# Patient Record
Sex: Male | Born: 1950 | Race: White | Hispanic: No | Marital: Married
Health system: Midwestern US, Academic
[De-identification: ages and names within clinical notes are randomized; demographics above are authoritative.]

---

## 2017-06-13 LAB — COMPREHENSIVE METABOLIC PANEL
Lab: 133 — ABNORMAL LOW (ref 136–145)
Lab: 96 — ABNORMAL LOW (ref 98–107)

## 2017-06-13 LAB — CBC: Lab: 5.6

## 2017-06-13 LAB — THYROID STIMULATING HORMONE-TSH: Lab: 3.9 — ABNORMAL HIGH (ref 80.0–99.0)

## 2017-06-13 LAB — PROSTATIC SPECIFIC ANTIGEN-PSA: Lab: 0.7

## 2017-07-22 ENCOUNTER — Encounter: Admit: 2017-07-22 | Discharge: 2017-07-22 | Payer: BC Managed Care – PPO

## 2017-07-22 DIAGNOSIS — I10 Essential (primary) hypertension: Principal | ICD-10-CM

## 2017-07-25 ENCOUNTER — Encounter: Admit: 2017-07-25 | Discharge: 2017-07-25 | Payer: BC Managed Care – PPO

## 2017-07-31 ENCOUNTER — Encounter: Admit: 2017-07-31 | Discharge: 2017-07-31 | Payer: BC Managed Care – PPO

## 2017-08-01 ENCOUNTER — Encounter: Admit: 2017-08-01 | Discharge: 2017-08-01 | Payer: BC Managed Care – PPO

## 2017-08-01 ENCOUNTER — Ambulatory Visit: Admit: 2017-08-01 | Discharge: 2017-08-02 | Payer: MEDICARE

## 2017-08-01 DIAGNOSIS — E6609 Other obesity due to excess calories: ICD-10-CM

## 2017-08-01 DIAGNOSIS — I159 Secondary hypertension, unspecified: ICD-10-CM

## 2017-08-01 DIAGNOSIS — F1010 Alcohol abuse, uncomplicated: ICD-10-CM

## 2017-08-01 DIAGNOSIS — R0602 Shortness of breath: ICD-10-CM

## 2017-08-01 DIAGNOSIS — E785 Hyperlipidemia, unspecified: Principal | ICD-10-CM

## 2017-08-01 DIAGNOSIS — E039 Hypothyroidism, unspecified: ICD-10-CM

## 2017-08-01 DIAGNOSIS — Z720 Tobacco use: ICD-10-CM

## 2017-08-01 MED ORDER — CARVEDILOL 25 MG PO TAB
25 mg | ORAL_TABLET | Freq: Two times a day (BID) | ORAL | 3 refills | 90.00000 days | Status: AC
Start: 2017-08-01 — End: 2018-03-04

## 2017-08-01 MED ORDER — LOSARTAN 50 MG PO TAB
50 mg | ORAL_TABLET | Freq: Two times a day (BID) | ORAL | 3 refills | 30.00000 days | Status: AC
Start: 2017-08-01 — End: 2017-08-28

## 2017-08-13 ENCOUNTER — Ambulatory Visit: Admit: 2017-08-13 | Discharge: 2017-08-13 | Payer: BC Managed Care – PPO

## 2017-08-13 ENCOUNTER — Ambulatory Visit: Admit: 2017-08-13 | Discharge: 2017-08-14 | Payer: MEDICARE

## 2017-08-13 ENCOUNTER — Encounter: Admit: 2017-08-13 | Discharge: 2017-08-13 | Payer: BC Managed Care – PPO

## 2017-08-13 DIAGNOSIS — I10 Essential (primary) hypertension: Principal | ICD-10-CM

## 2017-08-13 DIAGNOSIS — E785 Hyperlipidemia, unspecified: Principal | ICD-10-CM

## 2017-08-19 ENCOUNTER — Encounter: Admit: 2017-08-19 | Discharge: 2017-08-19 | Payer: BC Managed Care – PPO

## 2017-08-28 ENCOUNTER — Encounter: Admit: 2017-08-28 | Discharge: 2017-08-28 | Payer: BC Managed Care – PPO

## 2017-08-28 MED ORDER — LOSARTAN 50 MG PO TAB
50 mg | ORAL_TABLET | Freq: Two times a day (BID) | ORAL | 3 refills | 30.00000 days | Status: AC
Start: 2017-08-28 — End: 2017-12-12

## 2017-09-03 ENCOUNTER — Encounter: Admit: 2017-09-03 | Discharge: 2017-09-03 | Payer: BC Managed Care – PPO

## 2017-09-03 ENCOUNTER — Ambulatory Visit: Admit: 2017-09-03 | Discharge: 2017-09-04 | Payer: MEDICARE

## 2017-09-03 DIAGNOSIS — I159 Secondary hypertension, unspecified: Principal | ICD-10-CM

## 2017-09-03 DIAGNOSIS — F1010 Alcohol abuse, uncomplicated: ICD-10-CM

## 2017-09-03 DIAGNOSIS — Z01810 Encounter for preprocedural cardiovascular examination: ICD-10-CM

## 2017-09-03 DIAGNOSIS — Z720 Tobacco use: ICD-10-CM

## 2017-09-03 DIAGNOSIS — E039 Hypothyroidism, unspecified: ICD-10-CM

## 2017-09-03 DIAGNOSIS — E785 Hyperlipidemia, unspecified: ICD-10-CM

## 2017-09-03 DIAGNOSIS — R0602 Shortness of breath: ICD-10-CM

## 2017-09-03 DIAGNOSIS — E6609 Other obesity due to excess calories: ICD-10-CM

## 2017-09-13 ENCOUNTER — Encounter: Admit: 2017-09-13 | Discharge: 2017-09-13 | Payer: BC Managed Care – PPO

## 2017-11-26 ENCOUNTER — Encounter: Admit: 2017-11-26 | Discharge: 2017-11-26 | Payer: BC Managed Care – PPO

## 2017-12-12 ENCOUNTER — Ambulatory Visit: Admit: 2017-12-12 | Discharge: 2017-12-13 | Payer: MEDICARE

## 2017-12-12 ENCOUNTER — Encounter: Admit: 2017-12-12 | Discharge: 2017-12-12 | Payer: BC Managed Care – PPO

## 2017-12-12 DIAGNOSIS — I159 Secondary hypertension, unspecified: Principal | ICD-10-CM

## 2017-12-12 DIAGNOSIS — E6609 Other obesity due to excess calories: ICD-10-CM

## 2017-12-12 DIAGNOSIS — F1010 Alcohol abuse, uncomplicated: ICD-10-CM

## 2017-12-12 DIAGNOSIS — E785 Hyperlipidemia, unspecified: ICD-10-CM

## 2017-12-12 DIAGNOSIS — R0602 Shortness of breath: ICD-10-CM

## 2017-12-12 DIAGNOSIS — Z720 Tobacco use: ICD-10-CM

## 2017-12-12 DIAGNOSIS — E039 Hypothyroidism, unspecified: ICD-10-CM

## 2017-12-12 MED ORDER — LOSARTAN 50 MG PO TAB
50 mg | ORAL_TABLET | Freq: Two times a day (BID) | ORAL | 3 refills | 90.00000 days | Status: AC
Start: 2017-12-12 — End: 2018-03-04

## 2017-12-12 MED ORDER — CHLORTHALIDONE 25 MG PO TAB
25 mg | ORAL_TABLET | Freq: Every day | ORAL | 3 refills | Status: AC
Start: 2017-12-12 — End: 2018-02-27

## 2017-12-13 ENCOUNTER — Encounter: Admit: 2017-12-13 | Discharge: 2017-12-13 | Payer: BC Managed Care – PPO

## 2017-12-13 DIAGNOSIS — I159 Secondary hypertension, unspecified: Principal | ICD-10-CM

## 2017-12-20 ENCOUNTER — Encounter: Admit: 2017-12-20 | Discharge: 2017-12-20 | Payer: BC Managed Care – PPO

## 2017-12-20 DIAGNOSIS — I159 Secondary hypertension, unspecified: Principal | ICD-10-CM

## 2017-12-20 LAB — BASIC METABOLIC PANEL
Lab: 1.1
Lab: 107
Lab: 16 — ABNORMAL HIGH (ref 0–14)
Lab: 27
Lab: 9.7
Lab: 12
Lab: 134 — ABNORMAL LOW (ref 136–145)
Lab: 4.3
Lab: 95 — ABNORMAL LOW (ref 98–107)

## 2018-02-03 LAB — COMPREHENSIVE METABOLIC PANEL
Lab: 1.1 — ABNORMAL HIGH (ref 0.0–1.0)
Lab: 14
Lab: 18
Lab: 20
Lab: 4.3
Lab: 7.1
Lab: 73

## 2018-02-03 LAB — PROSTATIC SPECIFIC ANTIGEN-PSA: Lab: 0.9

## 2018-02-03 LAB — LIPID PROFILE
Lab: 109 — ABNORMAL HIGH (ref <100)
Lab: 194
Lab: 3
Lab: 32 — ABNORMAL LOW (ref 33.0–37.0)

## 2018-02-03 LAB — THYROID STIMULATING HORMONE-TSH: Lab: 2.1 — ABNORMAL HIGH (ref 35–60)

## 2018-02-21 ENCOUNTER — Encounter: Admit: 2018-02-21 | Discharge: 2018-02-21 | Payer: BC Managed Care – PPO

## 2018-02-27 ENCOUNTER — Encounter: Admit: 2018-02-27 | Discharge: 2018-02-27 | Payer: BC Managed Care – PPO

## 2018-02-27 ENCOUNTER — Ambulatory Visit: Admit: 2018-02-27 | Discharge: 2018-02-27 | Payer: MEDICARE

## 2018-02-27 DIAGNOSIS — E6609 Other obesity due to excess calories: Secondary | ICD-10-CM

## 2018-02-27 DIAGNOSIS — I10 Essential (primary) hypertension: Secondary | ICD-10-CM

## 2018-02-27 DIAGNOSIS — R0602 Shortness of breath: Secondary | ICD-10-CM

## 2018-02-27 DIAGNOSIS — E785 Hyperlipidemia, unspecified: Secondary | ICD-10-CM

## 2018-02-27 DIAGNOSIS — F1010 Alcohol abuse, uncomplicated: Secondary | ICD-10-CM

## 2018-03-04 ENCOUNTER — Encounter: Admit: 2018-03-04 | Discharge: 2018-03-04 | Payer: BC Managed Care – PPO

## 2018-03-04 MED ORDER — CARVEDILOL 25 MG PO TAB
25 mg | ORAL_TABLET | Freq: Two times a day (BID) | ORAL | 3 refills | 90.00000 days | Status: AC
Start: 2018-03-04 — End: 2018-09-23

## 2018-03-04 MED ORDER — LOSARTAN 50 MG PO TAB
50 mg | ORAL_TABLET | Freq: Two times a day (BID) | ORAL | 3 refills | 30.00000 days | Status: AC
Start: 2018-03-04 — End: 2018-09-23

## 2018-03-04 MED ORDER — LOSARTAN 50 MG PO TAB
50 mg | ORAL_TABLET | Freq: Two times a day (BID) | ORAL | 3 refills | 30.00000 days | Status: AC
Start: 2018-03-04 — End: 2018-03-04

## 2018-04-17 ENCOUNTER — Encounter: Admit: 2018-04-17 | Discharge: 2018-04-17 | Payer: BC Managed Care – PPO

## 2018-05-27 ENCOUNTER — Encounter: Admit: 2018-05-27 | Discharge: 2018-05-27 | Payer: BC Managed Care – PPO

## 2018-05-29 ENCOUNTER — Encounter: Admit: 2018-05-29 | Discharge: 2018-05-29 | Payer: BC Managed Care – PPO

## 2018-06-04 ENCOUNTER — Encounter: Admit: 2018-06-04 | Discharge: 2018-06-04 | Payer: BC Managed Care – PPO

## 2018-06-04 DIAGNOSIS — F1011 Alcohol abuse, in remission: Principal | ICD-10-CM

## 2018-06-04 DIAGNOSIS — E785 Hyperlipidemia, unspecified: ICD-10-CM

## 2018-06-04 DIAGNOSIS — Z8669 Personal history of other diseases of the nervous system and sense organs: ICD-10-CM

## 2018-06-04 DIAGNOSIS — I10 Essential (primary) hypertension: ICD-10-CM

## 2018-06-04 DIAGNOSIS — E039 Hypothyroidism, unspecified: ICD-10-CM

## 2018-06-05 ENCOUNTER — Encounter: Admit: 2018-06-05 | Discharge: 2018-06-05 | Payer: BC Managed Care – PPO

## 2018-06-05 ENCOUNTER — Ambulatory Visit: Admit: 2018-06-05 | Discharge: 2018-06-06 | Payer: MEDICARE

## 2018-06-05 DIAGNOSIS — I10 Essential (primary) hypertension: ICD-10-CM

## 2018-06-05 DIAGNOSIS — Z8669 Personal history of other diseases of the nervous system and sense organs: ICD-10-CM

## 2018-06-05 DIAGNOSIS — E785 Hyperlipidemia, unspecified: ICD-10-CM

## 2018-06-05 DIAGNOSIS — M21372 Foot drop, left foot: ICD-10-CM

## 2018-06-05 DIAGNOSIS — E039 Hypothyroidism, unspecified: ICD-10-CM

## 2018-06-05 DIAGNOSIS — F1011 Alcohol abuse, in remission: Principal | ICD-10-CM

## 2018-06-05 NOTE — Progress Notes
Travel screening completed.    Obtained patient's verbal consent to treat them and their agreement to [facility] financial policy and NPP via this telehealth visit during the [facility]    Pt was able to perform vitals with home monitoring device.

## 2018-06-06 DIAGNOSIS — R978 Other abnormal tumor markers: Secondary | ICD-10-CM

## 2018-06-06 DIAGNOSIS — N62 Hypertrophy of breast: Principal | ICD-10-CM

## 2018-06-13 ENCOUNTER — Encounter: Admit: 2018-06-13 | Discharge: 2018-06-13 | Payer: BC Managed Care – PPO

## 2018-06-13 DIAGNOSIS — E280 Estrogen excess: Principal | ICD-10-CM

## 2018-06-19 ENCOUNTER — Encounter: Admit: 2018-06-19 | Discharge: 2018-06-19 | Payer: BC Managed Care – PPO

## 2018-06-25 ENCOUNTER — Encounter: Admit: 2018-06-25 | Discharge: 2018-06-25 | Payer: BC Managed Care – PPO

## 2018-06-25 DIAGNOSIS — R978 Other abnormal tumor markers: ICD-10-CM

## 2018-06-25 DIAGNOSIS — E280 Estrogen excess: ICD-10-CM

## 2018-06-25 DIAGNOSIS — N62 Hypertrophy of breast: Principal | ICD-10-CM

## 2018-06-25 LAB — BETA-HCG: Lab: <1 m[IU]/mL

## 2018-06-25 LAB — PROLACTIN: Lab: 4.7 ng/mL (ref 2.0–18.0)

## 2018-08-12 ENCOUNTER — Encounter: Admit: 2018-08-12 | Discharge: 2018-08-12

## 2018-08-19 ENCOUNTER — Encounter: Admit: 2018-08-19 | Discharge: 2018-08-19

## 2018-08-27 ENCOUNTER — Encounter: Admit: 2018-08-27 | Discharge: 2018-08-27

## 2018-09-23 ENCOUNTER — Encounter: Admit: 2018-09-23 | Discharge: 2018-09-23

## 2018-09-23 ENCOUNTER — Ambulatory Visit: Admit: 2018-09-23 | Discharge: 2018-09-24

## 2018-09-23 DIAGNOSIS — M21372 Foot drop, left foot: Secondary | ICD-10-CM

## 2018-09-23 DIAGNOSIS — E785 Hyperlipidemia, unspecified: Secondary | ICD-10-CM

## 2018-09-23 DIAGNOSIS — E6609 Other obesity due to excess calories: Secondary | ICD-10-CM

## 2018-09-23 DIAGNOSIS — I10 Essential (primary) hypertension: Secondary | ICD-10-CM

## 2018-09-23 DIAGNOSIS — Z8669 Personal history of other diseases of the nervous system and sense organs: Secondary | ICD-10-CM

## 2018-09-23 DIAGNOSIS — E039 Hypothyroidism, unspecified: Secondary | ICD-10-CM

## 2018-09-23 DIAGNOSIS — Z720 Tobacco use: Secondary | ICD-10-CM

## 2018-09-23 DIAGNOSIS — E7800 Pure hypercholesterolemia, unspecified: Secondary | ICD-10-CM

## 2018-09-23 DIAGNOSIS — F1011 Alcohol abuse, in remission: Secondary | ICD-10-CM

## 2018-09-23 MED ORDER — LOSARTAN 50 MG PO TAB
50 mg | ORAL_TABLET | Freq: Two times a day (BID) | ORAL | 3 refills | 30.00000 days | Status: DC
Start: 2018-09-23 — End: 2019-08-31

## 2018-09-23 MED ORDER — CARVEDILOL 25 MG PO TAB
25 mg | ORAL_TABLET | Freq: Two times a day (BID) | ORAL | 3 refills | 90.00000 days | Status: AC
Start: 2018-09-23 — End: ?

## 2018-09-23 NOTE — Progress Notes
Date of Service: 09/23/2018    Tanis Malczewski is a 68 y.o. male.       HPI     Mr. Winstanley is a 68 year old white male with a history of hypertension, hyperlipidemia and risk factors for coronary artery disease.  We have been treating this patient for hypertension, he currently takes carvedilol 25 mg p.o. twice daily and losartan 50 mg p.o. twice daily.  In the past his blood pressure was elevated, with the current regimen the blood pressure has decreased but is still not optimally controlled.    Patient does admit on not following a low-salt diet and not exercising on a regular basis.    He remained asymptomatic from a cardiac standpoint, has not experienced symptoms of chest pain, no increased shortness of breath and no heart palpitations.  Patient was evaluated with a regadenoson MPI and a 2D echo Doppler study in June 2019 and they were both unremarkable.         Vitals:    09/23/18 0847 09/23/18 0854   BP: (!) 142/94 (!) 144/98   BP Source: Arm, Left Upper Arm, Right Upper   Pulse: 84    Temp: 36.9 ???C (98.4 ???F)    SpO2: 98%    Weight: 91.6 kg (202 lb)    Height: 1.727 m (5' 8)    PainSc: Zero      Body mass index is 30.71 kg/m???.     Past Medical History  Patient Active Problem List    Diagnosis Date Noted   ??? Preop cardiovascular exam 09/03/2017   ??? 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 ETOH abuse 08/01/2017   ??? Class 1 obesity due to excess calories without serious comorbidity with body mass index (BMI) of 30.0 to 30.9 in adult 08/01/2017   ??? SOB (shortness of breath) on exertion 08/01/2017   ??? Dyslipidemia 07/31/2017   ??? Hypertension 07/31/2017     Exercise Echo - 10/17/10 at [facility] - 1. Reduced functional capacity, no evidence of ischemia.       ??? Hypothyroidism 07/31/2017         Review of Systems   Constitution: Positive for malaise/fatigue.   HENT: Negative.    Eyes: Negative.    Cardiovascular: Positive for dyspnea on exertion.   Respiratory: Negative.    Endocrine: Negative. Hematologic/Lymphatic: Negative.    Skin: Negative.    Musculoskeletal: Positive for arthritis and joint pain.   Gastrointestinal: Positive for diarrhea.   Genitourinary: Negative.    Neurological: Negative.    Psychiatric/Behavioral: Negative.    Allergic/Immunologic: Negative.        Physical Exam  General Appearance: normal in appearance  Skin: warm, moist, no ulcers or xanthomas  Eyes: conjunctivae and lids normal, pupils are equal and round  Lips & Oral Mucosa: no pallor or cyanosis  Neck Veins: neck veins are flat, neck veins are not distended  Chest Inspection: chest is normal in appearance  Respiratory Effort: breathing comfortably, no respiratory distress  Auscultation/Percussion: lungs clear to auscultation, no rales or rhonchi, no wheezing  Cardiac Rhythm: regular rhythm and normal rate  Cardiac Auscultation: S1, S2 normal, no rub, no gallop  Murmurs: no murmur  Carotid Arteries: normal carotid upstroke bilaterally, no bruit  Lower Extremity Edema: no lower extremity edema  Abdominal Exam: soft, non-tender, no masses, bowel sounds normal  Liver & Spleen: no organomegaly  Language and Memory: patient responsive and seems to comprehend information  Neurologic Exam: neurological assessment grossly intact  Cardiovascular Studies  Twelve-lead EKG demonstrates normal sinus rhythm, nonspecific ST segment T wave changes    Problems Addressed Today  Encounter Diagnoses   Name Primary?   ??? Pure hypercholesterolemia Yes   ??? Essential hypertension    ??? Dyslipidemia    ??? Tobacco chew use    ??? Class 1 obesity due to excess calories without serious comorbidity with body mass index (BMI) of 30.0 to 30.9 in adult        Assessment and Plan     In summary: This is a 68 year old white male with a history of hypertension, no documented history of coronary artery disease, patient's blood pressure is better controlled but is not optimal.  Patient is not compliant with a low-salt diet, he lost a modest amount of weight and does not exercise on a regular basis.    Plan:    1.  Continue all current medications  2.  I did advise on a low-salt diet and I asked Mr. Fedor to consume food items that have less than 10% sodium.  3.  I also advised on weight reduction and regular physical activity  4.  Patient can start taking aspirin 81 mg p.o. daily as a preventative measure for CAD and CVAs  5.  Follow-up office visit in 6 months.         Current Medications (including today's revisions)  ??? carvedilol (COREG) 25 mg tablet Take one tablet by mouth twice daily with meals. Take with food.   ??? levothyroxine (SYNTHROID) 88 mcg tablet Take 88 mcg by mouth daily 30 minutes before breakfast.   ??? losartan (COZAAR) 50 mg tablet Take one tablet by mouth twice daily. Indications: high blood pressure   ??? omeprazole DR (PRILOSEC) 40 mg capsule Take 1 capsule by mouth daily.   ??? zinc sulfate 220 mg (50 mg elemental zinc) capsule Take 220 mg by mouth daily.

## 2018-09-26 ENCOUNTER — Encounter: Admit: 2018-09-26 | Discharge: 2018-09-26

## 2018-09-26 MED ORDER — ASPIRIN 81 MG PO TBEC
81 mg | ORAL_TABLET | Freq: Every day | ORAL | 0 refills | Status: DC
Start: 2018-09-26 — End: 2019-05-14

## 2018-09-26 NOTE — Telephone Encounter
Called pt with [facility] instructions to start an aspirin.  Pt verbalizes understanding and agrees with plan of care. Medication list updated.

## 2019-05-14 ENCOUNTER — Encounter: Admit: 2019-05-14 | Discharge: 2019-05-14 | Payer: BC Managed Care – PPO

## 2019-05-14 DIAGNOSIS — Z8669 Personal history of other diseases of the nervous system and sense organs: Secondary | ICD-10-CM

## 2019-05-14 DIAGNOSIS — E785 Hyperlipidemia, unspecified: Secondary | ICD-10-CM

## 2019-05-14 DIAGNOSIS — E6609 Other obesity due to excess calories: Secondary | ICD-10-CM

## 2019-05-14 DIAGNOSIS — E039 Hypothyroidism, unspecified: Secondary | ICD-10-CM

## 2019-05-14 DIAGNOSIS — R0602 Shortness of breath: Secondary | ICD-10-CM

## 2019-05-14 DIAGNOSIS — F1011 Alcohol abuse, in remission: Secondary | ICD-10-CM

## 2019-05-14 DIAGNOSIS — Z720 Tobacco use: Secondary | ICD-10-CM

## 2019-05-14 DIAGNOSIS — I10 Essential (primary) hypertension: Secondary | ICD-10-CM

## 2019-05-14 DIAGNOSIS — M21372 Foot drop, left foot: Secondary | ICD-10-CM

## 2019-05-14 DIAGNOSIS — F1010 Alcohol abuse, uncomplicated: Secondary | ICD-10-CM

## 2019-05-14 MED ORDER — ASPIRIN 81 MG PO CHEW
81 mg | ORAL_TABLET | Freq: Every day | ORAL | 0 refills | Status: AC
Start: 2019-05-14 — End: ?

## 2019-05-14 NOTE — Progress Notes
Date of Service: 05/14/2019    Johnny Mosley is a 69 y.o. male.       HPI     [name] is doing well from a cardiovascular standpoint.  He has been followed up in our office for issues related to hypertension, hyperlipidemia risk factors for CAD.  The blood pressure today was 134/92 mmHg in the left arm and 132/90 mmHg in the right arm with a heart rate of 77 bpm.  Currently patient is on carvedilol and losartan.    He has been able to lose approximately 4 pounds since the last office visit in August 2020.  Patient also tries to follow a low-sodium diet, overall he finds it difficult to lose weight and be compliant with a low-sodium high potassium diet.    He has been asymptomatic without any symptoms of chest pain or heart palpitations.         Vitals:    05/14/19 1010 05/14/19 1011   BP: (!) 134/92 (!) 132/90   BP Source: Arm, Left Upper Arm, Right Upper   Patient Position: Sitting Sitting   Pulse: 77    SpO2: 95%    Weight: 90.2 kg (198 lb 12.8 oz)    Height: 1.727 m (5' 8)    PainSc: Zero      Body mass index is 30.23 kg/m?Marland Kitchen     Past Medical History  Patient Active Problem List    Diagnosis Date Noted   ? Preop cardiovascular exam 09/03/2017   ? 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 ETOH abuse 08/01/2017   ? Class 1 obesity due to excess calories without serious comorbidity with body mass index (BMI) of 30.0 to 30.9 in adult 08/01/2017   ? SOB (shortness of breath) on exertion 08/01/2017   ? Dyslipidemia 07/31/2017   ? Essential hypertension 07/31/2017     Exercise Echo - 10/17/10 at [facility] - 1. Reduced functional capacity, no evidence of ischemia.       ? Hypothyroidism 07/31/2017         Review of Systems   Constitution: Negative.   HENT: Negative.    Eyes: Negative.    Cardiovascular: Negative.    Respiratory: Negative.    Endocrine: Negative.    Hematologic/Lymphatic: Negative.    Skin: Negative.    Musculoskeletal: Negative.    Gastrointestinal: Negative.    Genitourinary: Negative. Neurological: Negative.    Psychiatric/Behavioral: Negative.    Allergic/Immunologic: Negative.        Physical Exam  General Appearance: normal in appearance  Skin: warm, moist, no ulcers or xanthomas  Eyes: conjunctivae and lids normal, pupils are equal and round  Lips & Oral Mucosa: no pallor or cyanosis  Neck Veins: neck veins are flat, neck veins are not distended  Chest Inspection: chest is normal in appearance  Respiratory Effort: breathing comfortably, no respiratory distress  Auscultation/Percussion: lungs clear to auscultation, no rales or rhonchi, no wheezing  Cardiac Rhythm: regular rhythm and normal rate  Cardiac Auscultation: S1, S2 normal, no rub, no gallop  Murmurs: no murmur  Carotid Arteries: normal carotid upstroke bilaterally, no bruit  Lower Extremity Edema: no lower extremity edema  Abdominal Exam: soft, non-tender, no masses, bowel sounds normal  Liver & Spleen: no organomegaly  Language and Memory: patient responsive and seems to comprehend information  Neurologic Exam: neurological assessment grossly intact      Cardiovascular Studies      Problems Addressed Today  Encounter Diagnoses   Name Primary?   ?  Essential hypertension Yes   ? Dyslipidemia    ? Hypothyroidism, unspecified type    ? Tobacco chew use    ? ETOH abuse    ? Class 1 obesity due to excess calories without serious comorbidity with body mass index (BMI) of 30.0 to 30.9 in adult    ? SOB (shortness of breath) on exertion        Assessment and Plan     In summary: This is a 69 year old white male with hypertension, hyperlipidemia, no documented history of CAD, patient has managed to lose a few pounds and perhaps change his diet to some extent, his blood pressure is not optimal, but is better controlled.    Plan:    1.  Continue all current medications, resume aspirin 81 mg p.o. daily  2.  I did advise weight reduction low-sodium diet  3.  Follow-up office visit in 10 to 12 months.         Current Medications (including today's revisions)  ? aspirin EC 81 mg tablet Take one tablet by mouth daily. Take with food.   ? carvediloL (COREG) 25 mg tablet Take one tablet by mouth twice daily with meals. Take with food.  Indications: high blood pressure   ? levothyroxine (SYNTHROID) 88 mcg tablet Take 88 mcg by mouth daily 30 minutes before breakfast.   ? losartan (COZAAR) 50 mg tablet Take one tablet by mouth twice daily. Indications: high blood pressure   ? omeprazole DR (PRILOSEC) 40 mg capsule Take 1 capsule by mouth daily.   ? zinc sulfate 220 mg (50 mg elemental zinc) capsule Take 220 mg by mouth daily.

## 2019-08-31 ENCOUNTER — Encounter: Admit: 2019-08-31 | Discharge: 2019-08-31 | Payer: BC Managed Care – PPO

## 2019-08-31 MED ORDER — LOSARTAN 50 MG PO TAB
ORAL_TABLET | Freq: Two times a day (BID) | ORAL | 3 refills | 90.00000 days | Status: AC
Start: 2019-08-31 — End: ?

## 2019-10-16 ENCOUNTER — Encounter: Admit: 2019-10-16 | Discharge: 2019-10-16 | Payer: BC Managed Care – PPO

## 2019-10-16 MED ORDER — CARVEDILOL 25 MG PO TAB
ORAL_TABLET | Freq: Two times a day (BID) | ORAL | 3 refills | 90.00000 days | Status: AC
Start: 2019-10-16 — End: ?

## 2020-05-07 IMAGING — MR L-spine^LUMBAR BLOCK
5 series · 33 of 48 positions shown · IV contrast (agent unspecified)
Comparison: none

MRI lumbar without contrast:
CLINICAL HISTORY: Pain
LOW BACK PAIN WITH PAIN TO THE LEFT HIP AND LEFT LEG.  SOME NUMBNESS TO TOES.  STARTED AROUND [REDACTED]
WITH NO IMPROVEMENT WITH CHIROPRACTOR..

Comparisons: X-ray lumbar 06/13/2017

[Series 2: T2 · sagittal · 4.0mm · 0.62mm/px · 7 of 13 slices shown (1 of 2)]
[im 1/13]
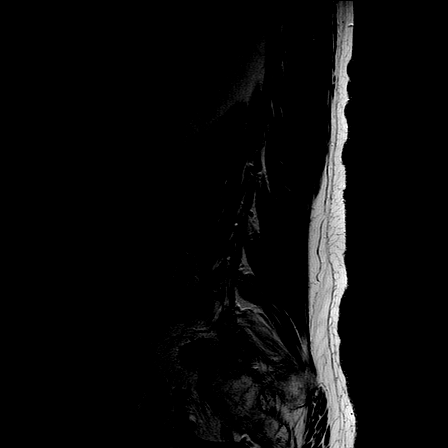
[im 3/13]
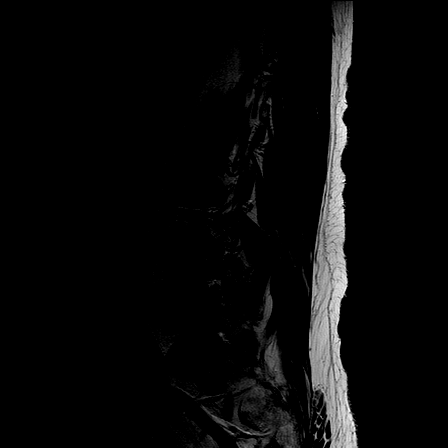
[im 5/13]
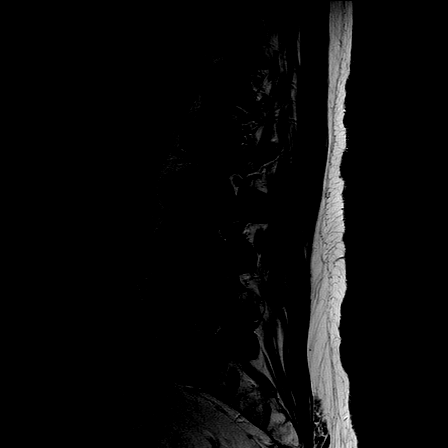
[im 7/13]
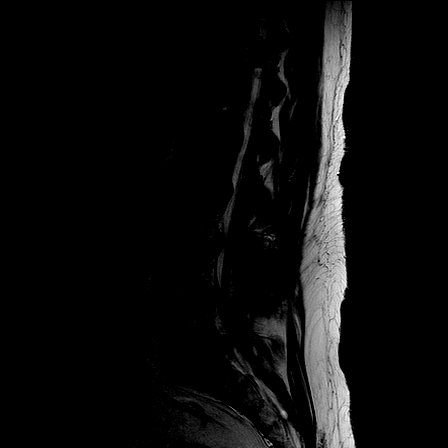
[im 9/13]
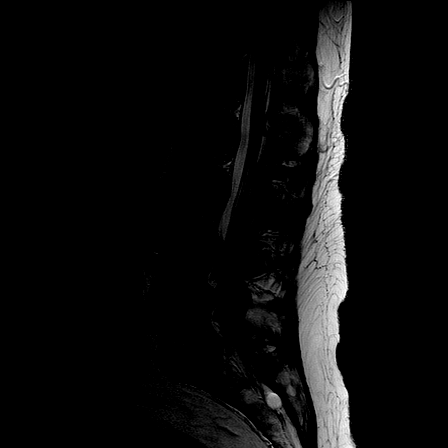
[im 11/13]
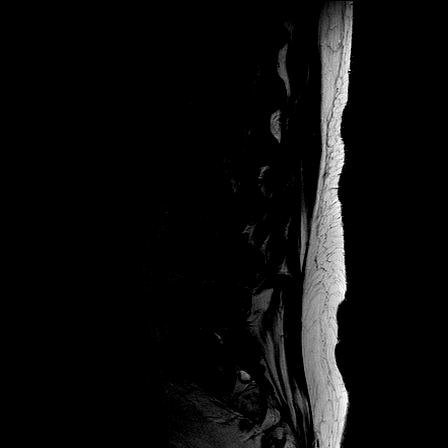
[im 13/13]
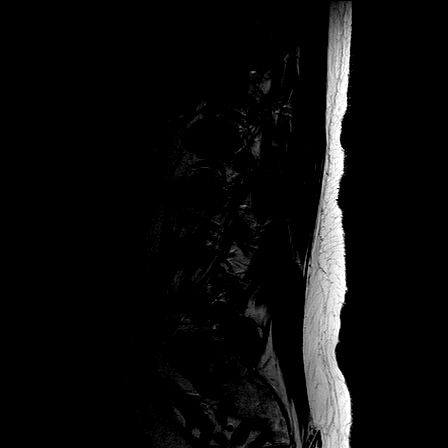

[Series 3: T1 · sagittal · 4.0mm · 0.73mm/px · 7 of 13 slices shown (1 of 2)]
[im 1/13]
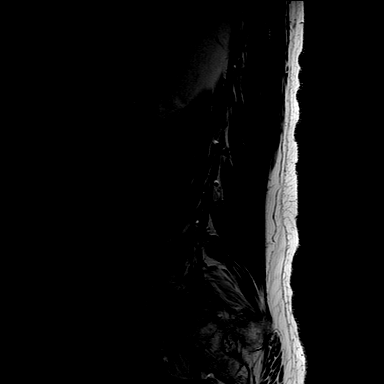
[im 3/13]
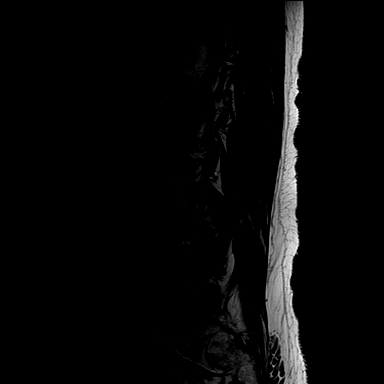
[im 5/13]
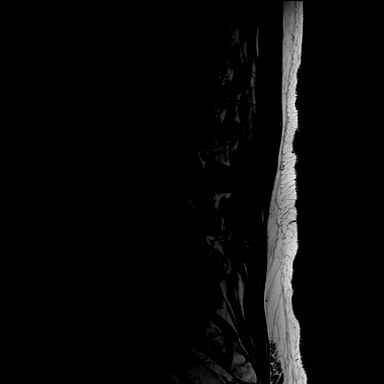
[im 7/13]
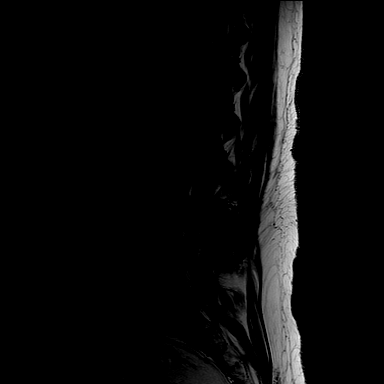
[im 9/13]
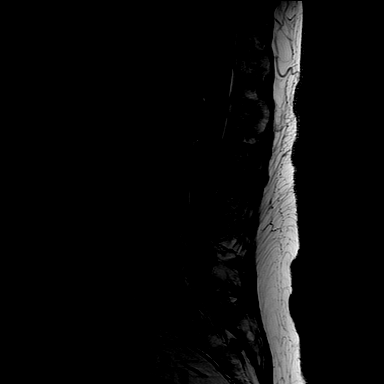
[im 11/13]
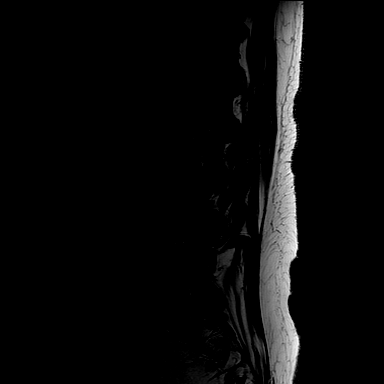
[im 13/13]
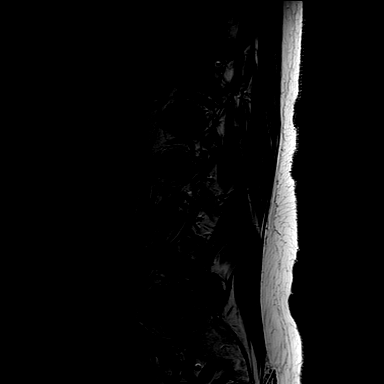

[Series 4: STIR · sagittal · 4.0mm · 0.55mm/px · 3 of 13 slices shown]
[im 1/13]
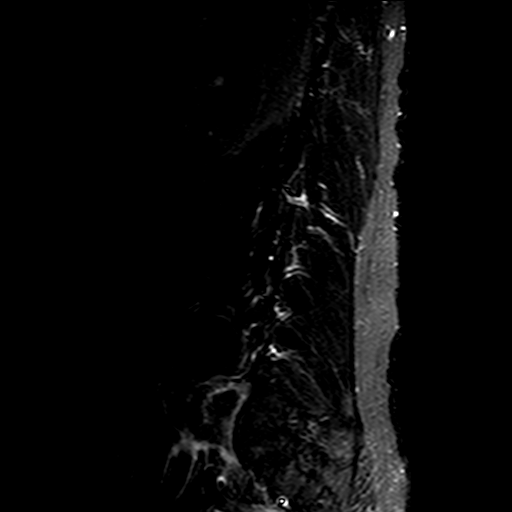
[im 3/13]
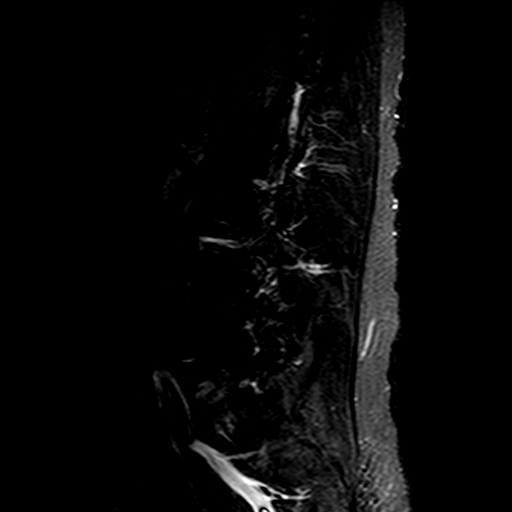
[im 5/13]
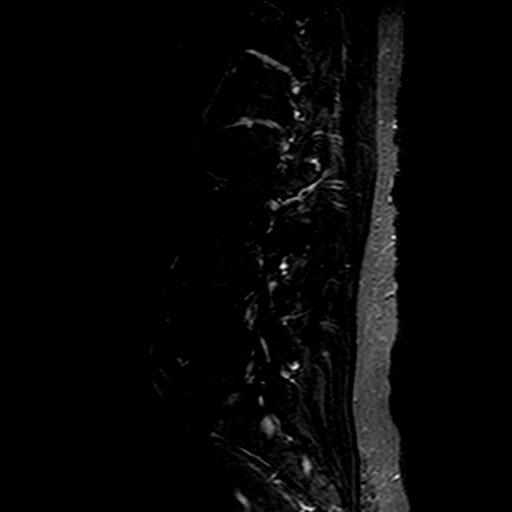

[Series 5: T2 · axial · 4.5mm · 0.49mm/px · z∈[-104,+82]mm · 8 of 29 slices shown (2 of 2)]
[im 1/29]
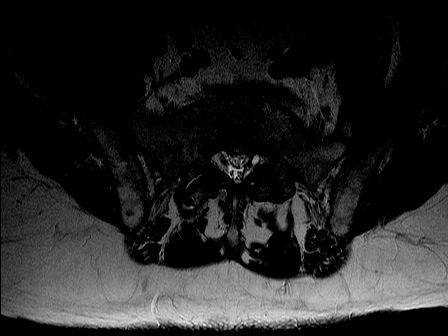
[im 5/29]
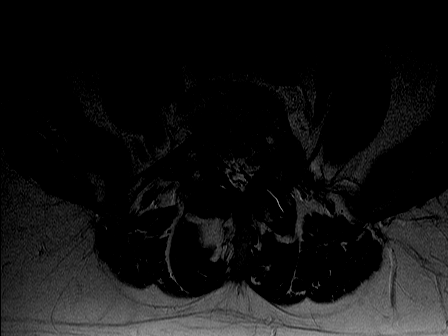
[im 9/29]
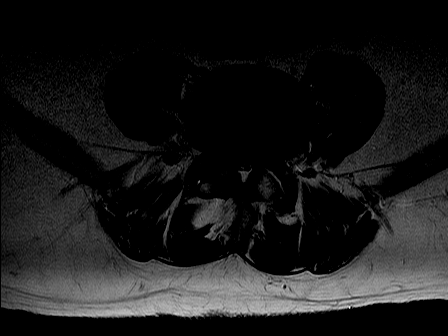
[im 13/29]
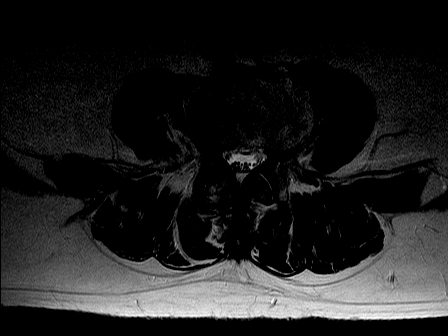
[im 16/29]
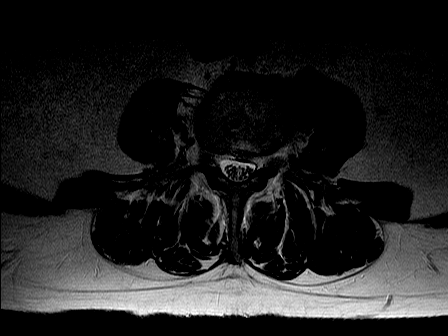
[im 20/29]
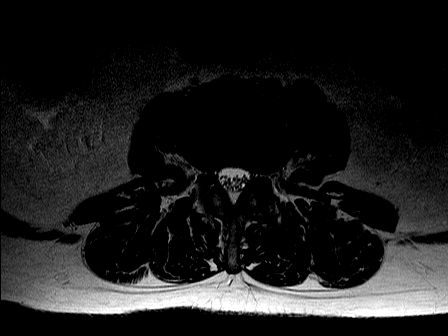
[im 24/29]
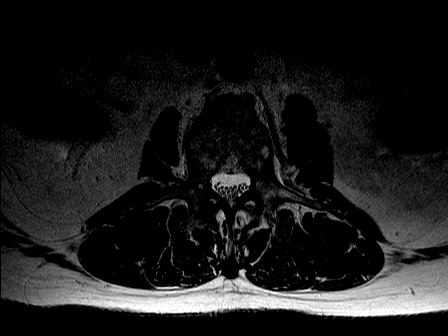
[im 29/29]
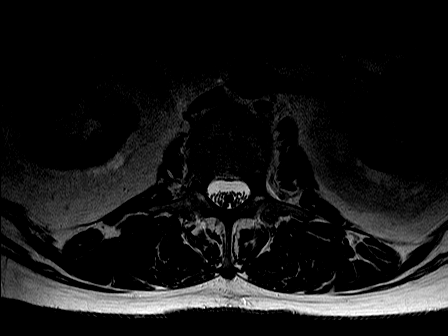

[Series 6: T1 · axial · 4.5mm · 0.86mm/px · z∈[-104,+82]mm · 8 of 29 slices shown (2 of 2)]
[im 1/29]
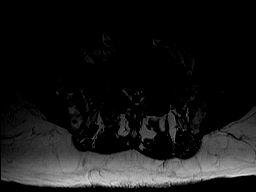
[im 5/29]
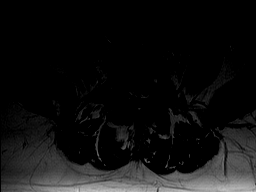
[im 9/29]
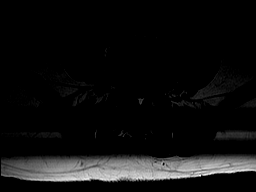
[im 13/29]
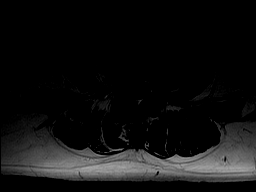
[im 16/29]
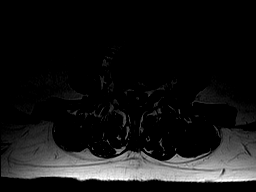
[im 20/29]
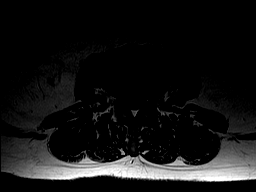
[im 24/29]
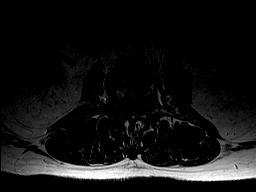
[im 29/29]
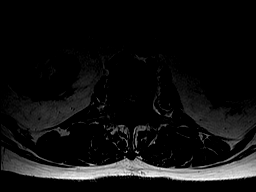

[33 of 48 positions shown; findings below may reference images not displayed]

FINDINGS: There is minimal convexity to the left with an apex at the level of L4 and a Cobb angle 4 degrees.
The marrow signal is slightly heterogeneous but is likely within normal limits. There is a Tarlov
cyst at the level of S1 and S2, measuring up to 1.1 centimeters. No significant marrow edema. No
acute displaced fracture or dislocation. The conus is located at the level of L1.

L1/2: No disc bulge.

L2/3: No disc bulge.

L3/4: Mild diffuse disc bulge with mild ligamentum flava hypertrophy and facet arthropathy. No
central canal stenosis. Mild bilateral neural foramen stenosis.

L4/5: Diffuse disc bulge with a moderate degree of ligamentum flava hypertrophy and facet
arthropathy. Moderate to severe central canal stenosis. Moderate degree of bilateral neural foramen
stenosis.

L5/S1: Diffuse disc bulge. Moderate degree of ligamentum flava hypertrophy and facet arthropathy.
Moderate degree of central canal stenosis. Moderate degree of bilateral neural foramen stenosis.
IMPRESSION: L4/5: Diffuse disc bulge with a moderate degree of ligamentum flava hypertrophy and facet
arthropathy. Moderate to severe central canal stenosis. Moderate degree of bilateral neural foramen
stenosis.

L5/S1: Diffuse disc bulge. Moderate degree of ligamentum flava hypertrophy and facet arthropathy.
Moderate degree of central canal stenosis. Moderate degree of bilateral neural foramen stenosis.

Follow up with a surgical consultation is recommended.

Tech Notes:

LOW BACK PAIN WITH PAIN TO THE LEFT HIP AND LEFT LEG.  SOME NUMBNESS TO TOES.  STARTED AROUND [REDACTED]
WITH NO IMPROVEMENT WITH CHIROPRACTOR.

## 2020-05-07 IMAGING — MR Hips^ROUTINE
5 of 7 series · 30 of 48 positions shown · non-contrast
Comparison: none

[Series 3: T1 · coronal · 4.0mm · 0.74mm/px · 5 of 20 slices shown]
[im 1/20]
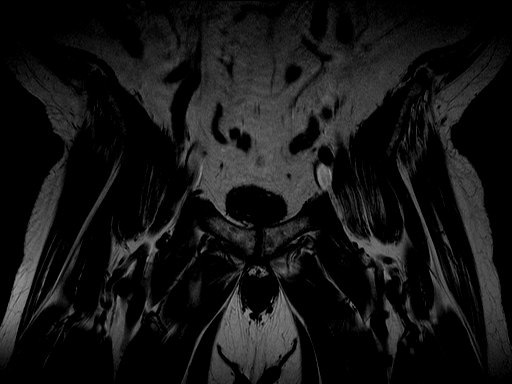
[im 5/20]
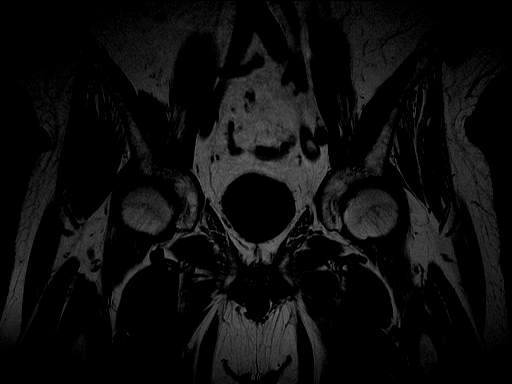
[im 10/20]
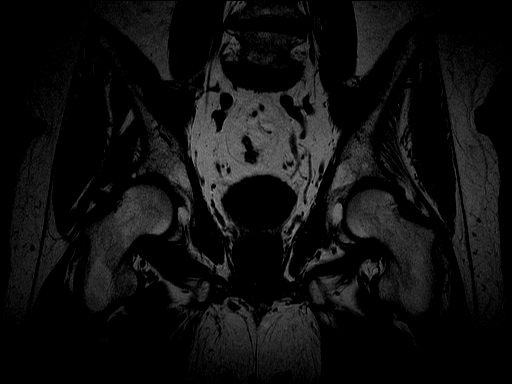
[im 15/20]
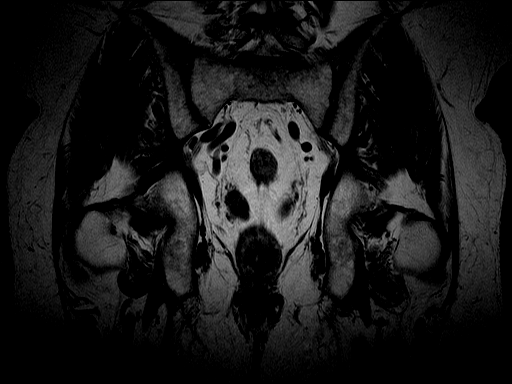
[im 20/20]
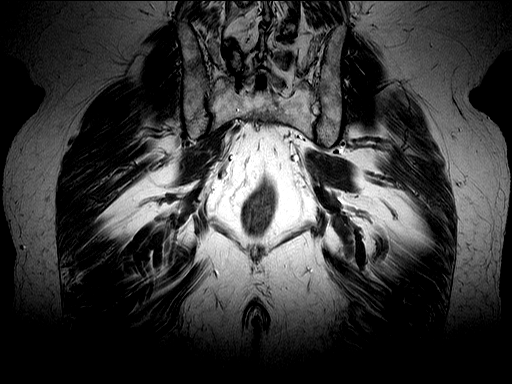

[Series 4: T2 fat-sat · axial · 5.0mm · 0.68mm/px · z∈[-100,+68]mm · 8 of 28 slices shown (1 of 2)]
[im 1/28]
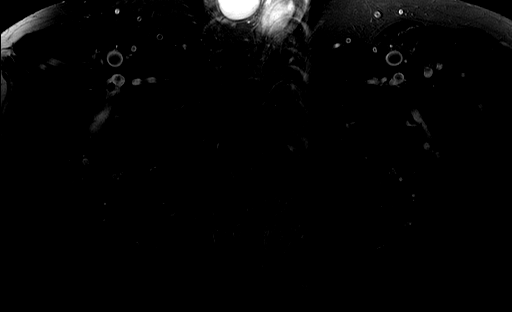
[im 4/28]
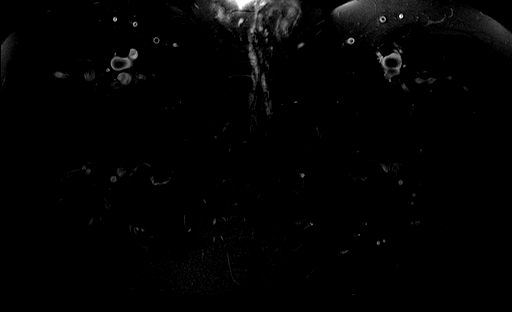
[im 8/28]
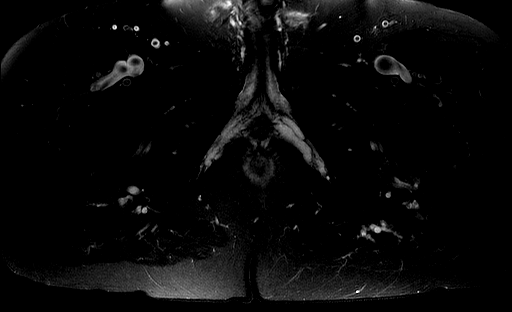
[im 12/28]
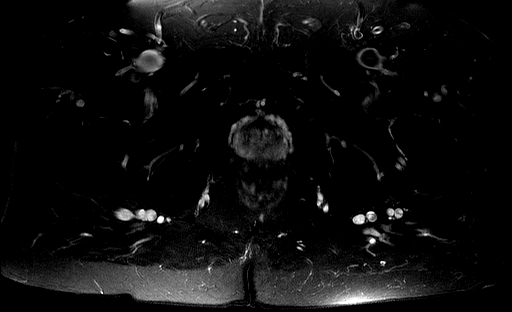
[im 16/28]
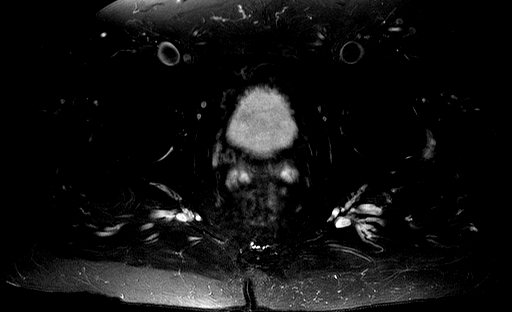
[im 20/28]
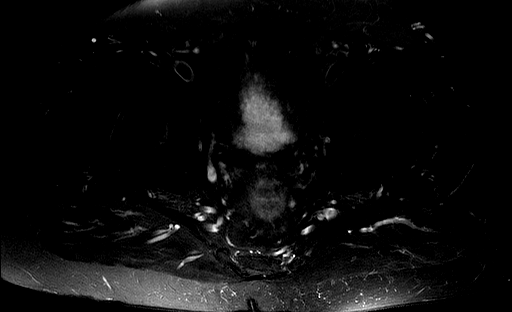
[im 24/28]
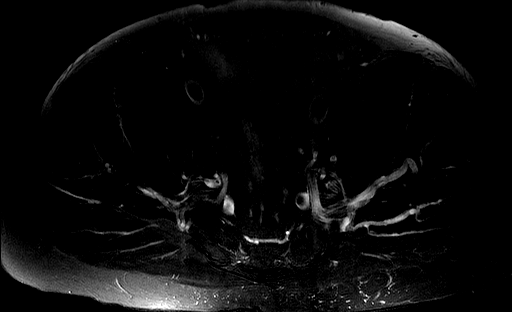
[im 28/28]
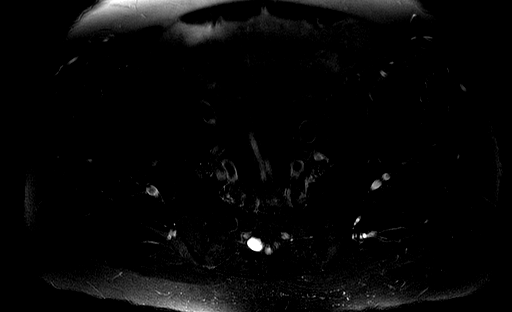

[Series 5: T1 fat-sat · axial · 6.0mm · 0.74mm/px · z∈[-107,+102]mm · 8 of 29 slices shown (1 of 2)]
[im 1/29]
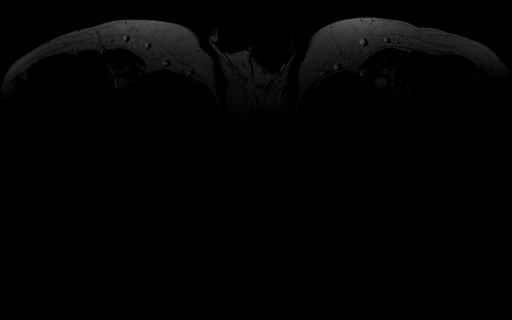
[im 5/29]
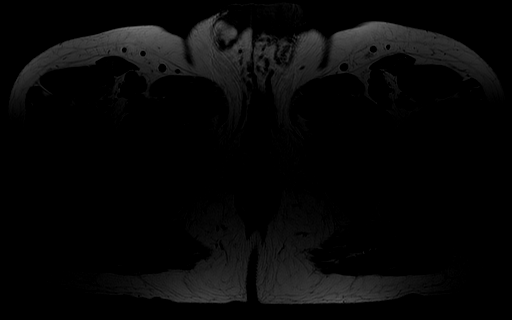
[im 9/29]
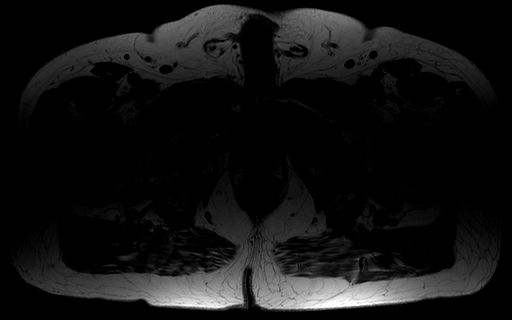
[im 13/29]
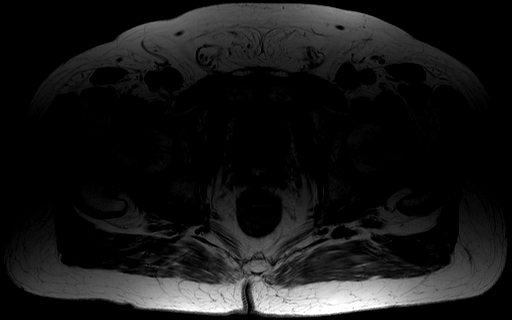
[im 17/29]
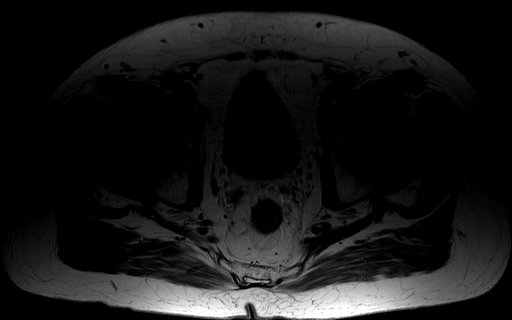
[im 21/29]
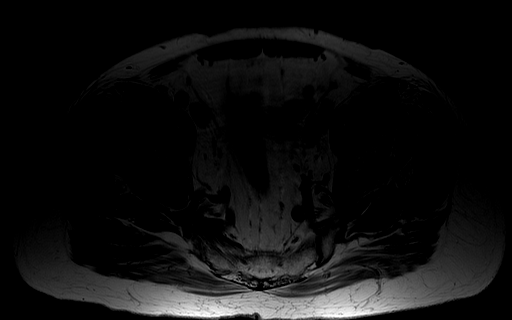
[im 25/29]
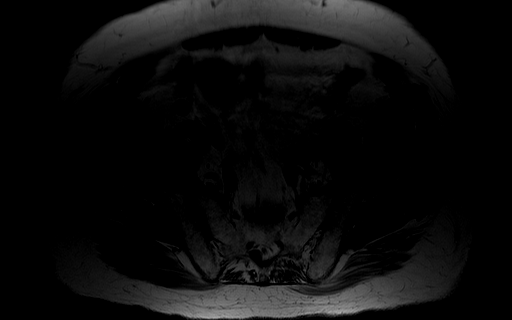
[im 29/29]
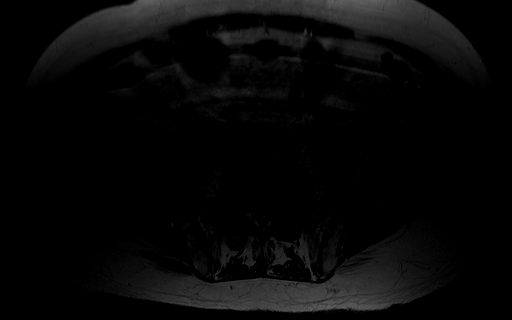

[Series 6: T2 fat-sat · sagittal · 3.5mm · 0.57mm/px · 6 of 21 slices shown (2 of 2)]
[im 1/21]
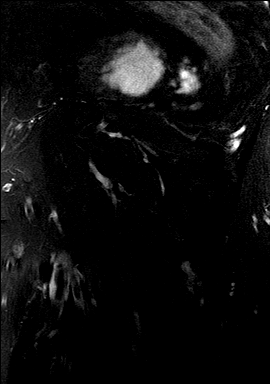
[im 5/21]
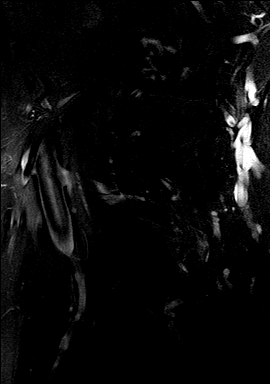
[im 9/21]
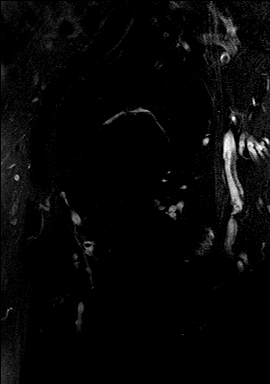
[im 13/21]
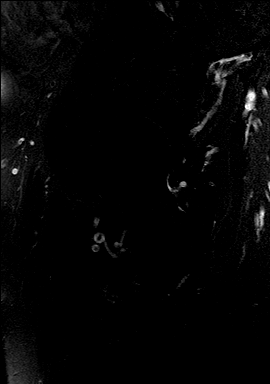
[im 17/21]
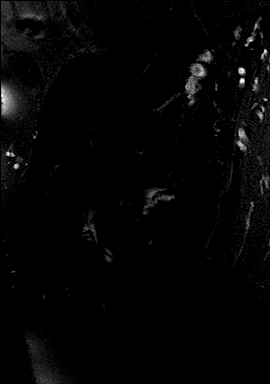
[im 21/21]
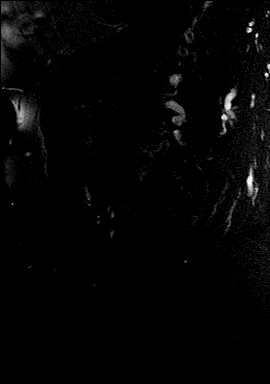

[Series 7: T1 fat-sat · axial · 6.0mm · 0.74mm/px · z∈[-107,-47]mm · 3 of 29 slices shown (2 of 2)]
[im 1/29]
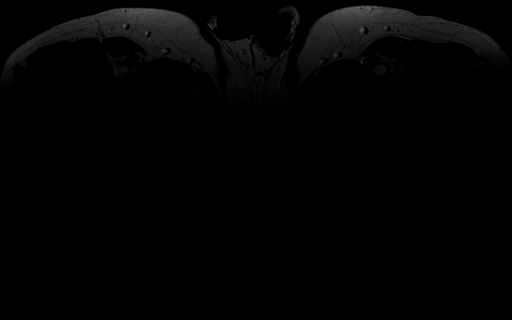
[im 5/29]
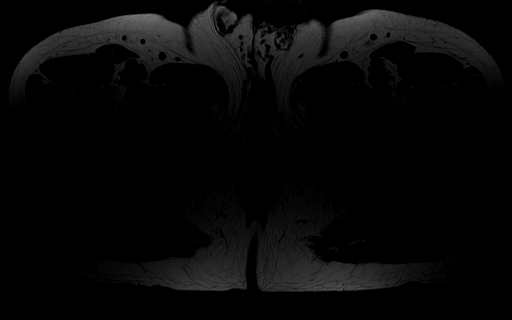
[im 9/29]
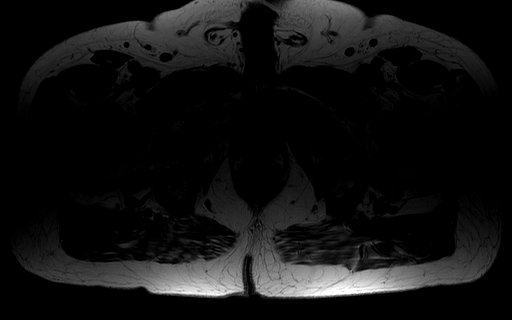

[30 of 48 positions shown; findings below may reference images not displayed]

DIAGNOSTIC STUDIES

EXAM

Left hip MRI.

INDICATION

Pain in hip L
PAIN TO LEFT HIP AND LOW BACK STARTING IN [REDACTED].  WORSENING.  FEELS LIKE THERE IS A "CATCH" IN THE
LEFT HIP.  RADIATION DOWN LEFT LEG.

TECHNIQUE

Noncontrast MRI of the left hip.

COMPARISONS

Left hip radiographs June 13, 2017

FINDINGS

Marrow/joint space: There is no fracture or bone edema. Mild marginal spurring of both hips. There
is lumbar spondylosis with mild apex left curvature of the lumbar spine. No aggressive osseous
lesion. No high-grade cartilage deficit is evident in the left hip. Mild loss of femoral head neck
junction cut back which could predispose towards femoral acetabular impingement. There is bilateral
sacroiliac joint degeneration. No periostitis.

Labrum: Examination is not optimized for labral evaluation without intra-articular contrast.
However, there is bright T2 signal in the anterior labrum bilaterally, more pronounced on the right,
most likely reflecting labral tearing.

Soft tissues: The hamstring tendons are intact. The short adductor musculature is within normal
limits. The hip flexors are also within normal limits. No iliopsoas or trochanteric bursitis. The
gluteal tendons show no disruption.

The sciatic nerves are symmetric and within normal limits without evidence of mass effect, edema,
or thickening. There is fat in the inguinal canal bilaterally but there is no significant inguinal
hernia. Sigmoid diverticulosis is extensive. No adjacent inflammatory stranding to suggest
diverticulitis. The urinary bladder wall is irregularly thickened. The prostate gland is mildly
hypertrophic. Correlate for evidence of urinary outlet obstruction.

IMPRESSION

Mild bilateral hip osteoarthropathy. Lumbar spondylosis and sacroiliac joint degeneration also
noted. Urinary bladder wall thickening is present. Findings could be due to chronic urinary outlet
obstruction versus cystitis.

Tech Notes:

PAIN TO LEFT HIP AND LOW BACK STARTING IN [REDACTED].  WORSENING.  FEELS LIKE THERE IS A "CATCH" IN THE
LEFT HIP.  RADIATION DOWN LEFT LEG.

## 2020-10-24 ENCOUNTER — Encounter: Admit: 2020-10-24 | Discharge: 2020-10-24 | Payer: BC Managed Care – PPO

## 2020-10-24 MED ORDER — CARVEDILOL 25 MG PO TAB
ORAL_TABLET | Freq: Two times a day (BID) | 3 refills
Start: 2020-10-24 — End: ?

## 2020-11-27 ENCOUNTER — Encounter: Admit: 2020-11-27 | Discharge: 2020-11-27 | Payer: BC Managed Care – PPO

## 2020-11-27 MED ORDER — LOSARTAN 50 MG PO TAB
ORAL_TABLET | Freq: Two times a day (BID) | 3 refills
Start: 2020-11-27 — End: ?

## 2021-02-24 ENCOUNTER — Encounter: Admit: 2021-02-24 | Discharge: 2021-02-24 | Payer: BC Managed Care – PPO

## 2021-02-24 MED ORDER — LOSARTAN 50 MG PO TAB
ORAL_TABLET | Freq: Two times a day (BID) | ORAL | 0 refills | 90.00000 days | Status: AC
Start: 2021-02-24 — End: ?

## 2021-03-18 ENCOUNTER — Encounter: Admit: 2021-03-18 | Discharge: 2021-03-18 | Payer: BC Managed Care – PPO

## 2021-03-18 MED ORDER — LOSARTAN 50 MG PO TAB
ORAL_TABLET | Freq: Two times a day (BID) | 0 refills
Start: 2021-03-18 — End: ?

## 2021-03-21 ENCOUNTER — Encounter: Admit: 2021-03-21 | Discharge: 2021-03-21 | Payer: BC Managed Care – PPO

## 2021-03-27 ENCOUNTER — Encounter: Admit: 2021-03-27 | Discharge: 2021-03-27 | Payer: BC Managed Care – PPO

## 2021-03-27 NOTE — Progress Notes
Contacted patient by phone to troubleshoot telehealth appointment with Dr.Marina Hannenon 03/28/2021. Patient plans to use personal computer to complete visit. Patient has downloaded [employer] to personal computer. Patient is aware of username and password to personal MyChart account. Educated patient on instructions on how to begin telehealth visit. Requested patient check blood pressure and heart rate with personal home monitor before scheduled appointment time. Requested patient to have current medication list available for appointment. Requested patient begin telehealth visit 15 minutes before scheduled appointment time. Patient voiced verbal understanding.

## 2021-03-28 ENCOUNTER — Encounter: Admit: 2021-03-28 | Discharge: 2021-03-28 | Payer: BC Managed Care – PPO

## 2021-03-28 ENCOUNTER — Ambulatory Visit: Admit: 2021-03-28 | Discharge: 2021-03-28 | Payer: BC Managed Care – PPO

## 2021-03-28 DIAGNOSIS — E039 Hypothyroidism, unspecified: Secondary | ICD-10-CM

## 2021-03-28 DIAGNOSIS — I10 Essential (primary) hypertension: Secondary | ICD-10-CM

## 2021-03-28 DIAGNOSIS — R0602 Shortness of breath: Secondary | ICD-10-CM

## 2021-03-28 DIAGNOSIS — E6609 Other obesity due to excess calories: Secondary | ICD-10-CM

## 2021-03-28 DIAGNOSIS — E785 Hyperlipidemia, unspecified: Secondary | ICD-10-CM

## 2021-03-28 DIAGNOSIS — Z8669 Personal history of other diseases of the nervous system and sense organs: Secondary | ICD-10-CM

## 2021-03-28 DIAGNOSIS — M21372 Foot drop, left foot: Secondary | ICD-10-CM

## 2021-03-28 DIAGNOSIS — F1010 Alcohol abuse, uncomplicated: Secondary | ICD-10-CM

## 2021-03-28 DIAGNOSIS — Z720 Tobacco use: Secondary | ICD-10-CM

## 2021-03-28 DIAGNOSIS — F1011 Alcohol abuse, in remission: Secondary | ICD-10-CM

## 2021-03-28 MED ORDER — LOSARTAN 50 MG PO TAB
50 mg | ORAL_TABLET | Freq: Two times a day (BID) | ORAL | 0 refills | 90.00000 days | Status: AC
Start: 2021-03-28 — End: ?

## 2021-03-28 MED ORDER — CARVEDILOL 25 MG PO TAB
25 mg | ORAL_TABLET | Freq: Two times a day (BID) | ORAL | 3 refills | 90.00000 days | Status: AC
Start: 2021-03-28 — End: ?

## 2021-03-28 NOTE — Progress Notes
Date of Service: 03/28/2021    Johnny Mosley is a 71 y.o. male.       HPI     Johnny Mosley is a 71 y.o. white  male with a history of hypertension, hyperlipidemia, use factors for coronary artery disease, no documented history of hypertension.    Patient was seen today with the help of a telehealth/Zoom video conference.    Patient does not report having any symptoms of chest pain, no heart palpitations, he has not taken any sublingual nitroglycerin.    On March 17, 2021 he suffered  a fracture of the right ankle, it was a mechanical fall, he missed 2 steps.  Patient did undergo surgery and he is currently recovering.    In the past patient was evaluated with a stress test and a 2D echo Doppler study, this were performed in June 2019, that demonstrated normal left ventricular static function, it was no evidence of ischemia and no evidence of structural heart disease.         Vitals:    03/28/21 1531   BP: (!) 141/89  Comment: Patient stated current reading today   BP Source: Arm, Left Upper   Pulse: 80  Comment: Patient stated current reading today   O2 Device: None (Room air)   PainSc: Zero   Weight: 88.9 kg (196 lb)  Comment: Patient stated current weight today   Height: 170.2 cm (5' 7)     Body mass index is 30.7 kg/m?Marland Kitchen     Past Medical History  Patient Active Problem List    Diagnosis Date Noted   ? Preop cardiovascular exam 09/03/2017   ? 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 ETOH abuse 08/01/2017   ? Class 1 obesity due to excess calories without serious comorbidity with body mass index (BMI) of 30.0 to 30.9 in adult 08/01/2017   ? SOB (shortness of breath) on exertion 08/01/2017   ? Dyslipidemia 07/31/2017   ? Essential hypertension 07/31/2017     Exercise Echo - 10/17/10 at [facility] - 1. Reduced functional capacity, no evidence of ischemia.       ? Hypothyroidism 07/31/2017         Review of Systems   Constitutional: Negative.   HENT: Negative.    Eyes: Negative.    Cardiovascular: Positive for leg swelling.   Respiratory: Negative.    Endocrine: Negative.    Hematologic/Lymphatic: Negative.    Skin: Negative.    Musculoskeletal: Positive for joint pain and joint swelling.   Gastrointestinal: Negative.    Genitourinary: Negative.    Neurological: Positive for numbness and paresthesias.   Psychiatric/Behavioral: Negative.    Allergic/Immunologic: Negative.        Physical Exam  Physical exam could not be performed, this was a telehealth video visit    Cardiovascular Studies      Cardiovascular Health Factors  Vitals BP Readings from Last 3 Encounters:   03/28/21 (!) 141/89   05/14/19 (!) 132/90   09/23/18 (!) 144/98     Wt Readings from Last 3 Encounters:   03/28/21 88.9 kg (196 lb)   05/14/19 90.2 kg (198 lb 12.8 oz)   09/23/18 91.6 kg (202 lb)     BMI Readings from Last 3 Encounters:   03/28/21 30.70 kg/m?   05/14/19 30.23 kg/m?   09/23/18 30.71 kg/m?      Smoking Social History     Tobacco Use   Smoking Status Former   ? Types: Cigarettes   ?  Quit date: 08/01/1978   ? Years since quitting: 42.6   Smokeless Tobacco Current   ? Types: Chew      Lipid Profile Cholesterol   Date Value Ref Range Status   03/09/2021 186  Final     HDL   Date Value Ref Range Status   03/09/2021 61  Final     LDL   Date Value Ref Range Status   03/09/2021 106 (H) <100 Final     Triglycerides   Date Value Ref Range Status   03/09/2021 95  Final      Blood Sugar No results found for: HGBA1C  Glucose   Date Value Ref Range Status   03/09/2021 106 (H) 70 - 105 Final   02/03/2018 119 (H) 80 - 115 Final   12/20/2017 107  Final          Problems Addressed Today  Encounter Diagnoses   Name Primary?   ? Essential hypertension Yes   ? Class 1 obesity due to excess calories without serious comorbidity with body mass index (BMI) of 30.0 to 30.9 in adult    ? Dyslipidemia    ? ETOH abuse    ? Hypothyroidism, unspecified type    ? SOB (shortness of breath) on exertion    ? Tobacco chew use        Assessment and Plan     Assessment:    1. Primary hypertension  ? Currently patient is on a combination of carvedilol and losartan  ? Blood pressure is not optimally controlled, he has been experiencing some pain because of right ankle injury  2.  Hyperlipidemia with elevated LDL-patient is not on statin therapy  3.  Hypothyroidism-on thyroid replacement therapy  4.  Family history of coronary artery disease  5.  Right ankle fracture-this occurred on September 14, 2021 due to mechanical fall and a loss of consciousness  6.  Status post orthopedic intervention on the right ankle --currently recovering    Plan:    1.  I did recommend strict adherence to a low-sodium diet  2.  I recommended to continue all current medications and I renewed the prescriptions for losartan and carvedilol  3.  Follow-up office visit in 10 to 12 months.     Total Time Today was 30 minutes in the following activities: Preparing to see the patient, Obtaining and/or reviewing separately obtained history, Performing a medically appropriate examination and/or evaluation, Counseling and educating the patient/family/caregiver, Ordering medications, tests, or procedures, Referring and communication with other health care professionals (when not separately reported), Documenting clinical information in the electronic or other health record, Independently interpreting results (not separately reported) and communicating results to the patient/family/caregiver and Care coordination (not separately reported)         Current Medications (including today's revisions)  ? carvediloL (COREG) 25 mg tablet TAKE 1 TABLET BY MOUTH TWICE A DAY WITH MEALS   ? levothyroxine (SYNTHROID) 88 mcg tablet Take 88 mcg by mouth daily 30 minutes before breakfast.   ? losartan (COZAAR) 50 mg tablet TAKE 1 TABLET BY MOUTH TWICE A DAY   ? MEN'S MULTI-VITAMIN PO Take 1 tablet by mouth daily.   ? omeprazole DR (PRILOSEC) 40 mg capsule Take 1 capsule by mouth every 48 hours.   ? zinc sulfate 220 mg (50 mg elemental zinc) capsule Take 1 capsule by mouth daily.

## 2021-09-04 ENCOUNTER — Encounter: Admit: 2021-09-04 | Discharge: 2021-09-04 | Payer: BC Managed Care – PPO

## 2021-09-04 MED ORDER — LOSARTAN 50 MG PO TAB
50 mg | ORAL_TABLET | Freq: Two times a day (BID) | ORAL | 1 refills | 90.00000 days | Status: AC
Start: 2021-09-04 — End: ?

## 2021-11-27 ENCOUNTER — Encounter: Admit: 2021-11-27 | Discharge: 2021-11-27 | Payer: BC Managed Care – PPO

## 2022-01-25 ENCOUNTER — Encounter: Admit: 2022-01-25 | Discharge: 2022-01-25 | Payer: BC Managed Care – PPO

## 2022-01-25 MED ORDER — LOSARTAN 50 MG PO TAB
50 mg | ORAL_TABLET | Freq: Two times a day (BID) | ORAL | 3 refills | 90.00000 days | Status: AC
Start: 2022-01-25 — End: ?

## 2022-03-23 ENCOUNTER — Encounter: Admit: 2022-03-23 | Discharge: 2022-03-23 | Payer: BC Managed Care – PPO

## 2022-03-23 MED ORDER — CARVEDILOL 25 MG PO TAB
25 mg | ORAL_TABLET | Freq: Two times a day (BID) | ORAL | 1 refills | 90.00000 days | Status: AC
Start: 2022-03-23 — End: ?

## 2022-03-29 ENCOUNTER — Encounter: Admit: 2022-03-29 | Discharge: 2022-03-29 | Payer: BC Managed Care – PPO

## 2022-03-29 DIAGNOSIS — F1011 Alcohol abuse, in remission: Secondary | ICD-10-CM

## 2022-03-29 DIAGNOSIS — E785 Hyperlipidemia, unspecified: Secondary | ICD-10-CM

## 2022-03-29 DIAGNOSIS — Z8669 Personal history of other diseases of the nervous system and sense organs: Secondary | ICD-10-CM

## 2022-03-29 DIAGNOSIS — M21372 Foot drop, left foot: Secondary | ICD-10-CM

## 2022-03-29 DIAGNOSIS — Z720 Tobacco use: Secondary | ICD-10-CM

## 2022-03-29 DIAGNOSIS — Z136 Encounter for screening for cardiovascular disorders: Secondary | ICD-10-CM

## 2022-03-29 DIAGNOSIS — E6609 Other obesity due to excess calories: Secondary | ICD-10-CM

## 2022-03-29 DIAGNOSIS — I10 Essential (primary) hypertension: Secondary | ICD-10-CM

## 2022-03-29 DIAGNOSIS — E039 Hypothyroidism, unspecified: Secondary | ICD-10-CM

## 2022-03-29 DIAGNOSIS — F1010 Alcohol abuse, uncomplicated: Secondary | ICD-10-CM

## 2022-03-29 MED ORDER — LOSARTAN 50 MG PO TAB
50 mg | ORAL_TABLET | Freq: Two times a day (BID) | ORAL | 3 refills | 90.00000 days | Status: AC
Start: 2022-03-29 — End: ?

## 2022-03-29 MED ORDER — CARVEDILOL 25 MG PO TAB
25 mg | ORAL_TABLET | Freq: Two times a day (BID) | ORAL | 3 refills | 90.00000 days | Status: AC
Start: 2022-03-29 — End: ?

## 2022-03-29 NOTE — Progress Notes
Date of Service: 03/29/2022    Johnny Mosley is a 72 y.o. male.       HPI      Johnny Mosley is a 72 y.o. white male   with a history of primary hypertension, hyperlipidemia, risk factors for CAD, history of right fractured ankle due to mechanical fall, status post surgical intervention, currently doing well.    Patient does not have any documented study of coronary artery disease.    In June 2019 he was evaluated with the following:  1.  Regadenoson MPI stress test-no ischemia, LVEF = 76%.  2.  2D echo Doppler the-mild diastolic dysfunction, normal LVEF, on the study the RV appeared to be mildly enlarged with mild hypokinesis, biatrial enlargement, no significant valvular abnormalities, normal PAP = 27 mmHg.    Patient does not report having any symptoms of chest pain, no heart palpitations, no presyncope or syncope.         Vitals:    03/29/22 1429   BP: (!) 146/84   BP Source: Arm, Left Upper   Pulse: 88   SpO2: 98%   O2 Device: None (Room air)   PainSc: Zero   Weight: 92.1 kg (203 lb)   Height: 172.7 cm (5' 8)     Body mass index is 30.87 kg/m?Marland Kitchen     Past Medical History  Patient Active Problem List    Diagnosis Date Noted    Preop cardiovascular exam 09/03/2017    Tobacco chew use 08/01/2017    ETOH abuse 08/01/2017    Class 1 obesity due to excess calories without serious comorbidity with body mass index (BMI) of 30.0 to 30.9 in adult 08/01/2017    SOB (shortness of breath) on exertion 08/01/2017    Dyslipidemia 07/31/2017    Essential hypertension 07/31/2017     Exercise Echo - 10/17/10 at [facility] - 1. Reduced functional capacity, no evidence of ischemia.        Hypothyroidism 07/31/2017         Review of Systems   Constitutional: Negative.   HENT: Negative.     Eyes: Negative.    Cardiovascular:  Positive for dyspnea on exertion.   Respiratory: Negative.     Endocrine: Negative.    Hematologic/Lymphatic: Negative.    Skin: Negative.    Musculoskeletal: Negative.    Gastrointestinal: Negative. Genitourinary: Negative.    Neurological:  Positive for numbness and paresthesias.   Psychiatric/Behavioral: Negative.     Allergic/Immunologic: Negative.        Physical Exam  General Appearance: normal in appearance  Skin: warm, moist, no ulcers or xanthomas  Eyes: conjunctivae and lids normal, pupils are equal and round  Lips & Oral Mucosa: no pallor or cyanosis  Neck Veins: neck veins are flat, neck veins are not distended  Chest Inspection: chest is normal in appearance  Respiratory Effort: breathing comfortably, no respiratory distress  Auscultation/Percussion: lungs clear to auscultation, no rales or rhonchi, no wheezing  Cardiac Rhythm: regular rhythm and normal rate  Cardiac Auscultation: S1, S2 normal, no rub, no gallop  Murmurs: no murmur  Carotid Arteries: normal carotid upstroke bilaterally, no bruit  Lower Extremity Edema: no lower extremity edema  Abdominal Exam: soft, non-tender, no masses, bowel sounds normal  Liver & Spleen: no organomegaly  Language and Memory: patient responsive and seems to comprehend information  Neurologic Exam: neurological assessment grossly intact      Cardiovascular Studies    Twelve-lead EKG demonstrates normal sinus rhythm, ventricular rate  82 bpm, no axis deviation, no ST segment T wave changes.      Cardiovascular Health Factors  Vitals BP Readings from Last 3 Encounters:   03/29/22 (!) 146/84   03/28/21 (!) 141/89   05/14/19 (!) 132/90     Wt Readings from Last 3 Encounters:   03/29/22 92.1 kg (203 lb)   03/28/21 88.9 kg (196 lb)   05/14/19 90.2 kg (198 lb 12.8 oz)     BMI Readings from Last 3 Encounters:   03/29/22 30.87 kg/m?   03/28/21 30.70 kg/m?   05/14/19 30.23 kg/m?      Smoking Social History     Tobacco Use   Smoking Status Former    Types: Cigarettes    Quit date: 08/01/1978    Years since quitting: 43.6   Smokeless Tobacco Current    Types: Chew      Lipid Profile Cholesterol   Date Value Ref Range Status   03/09/2021 186  Final     HDL   Date Value Ref Range Status   03/09/2021 61  Final     LDL   Date Value Ref Range Status   03/09/2021 106 (H) <100 Final     Triglycerides   Date Value Ref Range Status   03/09/2021 95  Final      Blood Sugar No results found for: HGBA1C  Glucose   Date Value Ref Range Status   03/09/2021 106 (H) 70 - 105 Final   02/03/2018 119 (H) 80 - 115 Final   12/20/2017 107  Final          Problems Addressed Today  Encounter Diagnoses   Name Primary?    Essential hypertension Yes    Class 1 obesity due to excess calories without serious comorbidity with body mass index (BMI) of 30.0 to 30.9 in adult     ETOH abuse     Dyslipidemia     Hypothyroidism, unspecified type     Tobacco chew use     Screening for heart disease        Assessment and Plan       Assessment:    1.  Primary hypertension  Borderline elevated  Noncompliant with a low-sodium diet  Currently on carvedilol and losartan  2.  Hyperlipidemia-mildly elevated LDL, not on statin therapy  3.  Family history of coronary artery disease and ischemic cardiomyopathy  4.  Hypothyroidism-on thyroid replacement therapy  5.  Obesity, elevated BMI = 30.87 kg/m?  6.  Status post right ankle fracture due to mechanical fall  This occurred in July 2023  Patient did undergo surgical intervention    Plan:    1.  Continue all current medications, I did renew the carvedilol and the losartan  2.  I recommended a strict low-sodium diet  3.  Continue risk factors modifications  4.  Follow-up office visit in 1 year, we can consider further evaluation with a 2D echo Doppler study next year.      Total Time Today was 40 minutes in the following activities: Preparing to see the patient, Obtaining and/or reviewing separately obtained history, Performing a medically appropriate examination and/or evaluation, Counseling and educating the patient/family/caregiver, Ordering medications, tests, or procedures, Referring and communication with other health care professionals (when not separately reported), Documenting clinical information in the electronic or other health record, Independently interpreting results (not separately reported) and communicating results to the patient/family/caregiver, and Care coordination (not separately reported)  Current Medications (including today's revisions)   carvediloL (COREG) 25 mg tablet TAKE ONE TABLET BY MOUTH TWICE DAILY. TAKE WITH FOOD INDICATIONS: HIGH BLOOD PRESSURE    levothyroxine (SYNTHROID) 88 mcg tablet Take one tablet by mouth daily 30 minutes before breakfast.    losartan (COZAAR) 50 mg tablet TAKE ONE TABLET BY MOUTH TWICE DAILY. INDICATIONS: HIGH BLOOD PRESSURE    MEN'S MULTI-VITAMIN PO Take 1 tablet by mouth daily.    omeprazole DR (PRILOSEC) 40 mg capsule Take one capsule by mouth every 48 hours.    sildenafiL (VIAGRA) 50 mg tablet Take one tablet by mouth as Needed for Erectile dysfunction.

## 2022-10-01 ENCOUNTER — Inpatient Hospital Stay: Admit: 2022-10-01 | Discharge: 2022-10-01 | Payer: BC Managed Care – PPO

## 2022-10-01 ENCOUNTER — Encounter: Admit: 2022-10-01 | Discharge: 2022-10-01 | Payer: BC Managed Care – PPO

## 2022-10-01 ENCOUNTER — Inpatient Hospital Stay
Admit: 2022-10-01 | Discharge: 2022-10-02 | Disposition: A | Discharge status: Home / Self Care | Payer: MEDICARE | Source: Other Acute Inpatient Hospital | Attending: Surgical Critical Care | Admitting: Surgical Critical Care

## 2022-10-01 DIAGNOSIS — T1490XA Injury, unspecified, initial encounter: Secondary | ICD-10-CM

## 2022-10-01 DIAGNOSIS — S065XAA Traumatic subdural hemorrhage with loss of consciousness status unknown, initial encounter (HCC): Secondary | ICD-10-CM

## 2022-10-01 LAB — COMPREHENSIVE METABOLIC PANEL
ALBUMIN: 3.7 g/dL (ref 3.5–5.0)
ALK PHOSPHATASE: 49 U/L (ref 25–110)
ALT: 18 U/L (ref 7–56)
ANION GAP: 11 (ref 3–12)
AST: 23 U/L (ref 7–40)
CO2: 24 MMOL/L (ref 21–30)
CREATININE: 0.8 mg/dL (ref >60)
EGFR: >60 mL/min (ref >60)
SODIUM: 133 MMOL/L — ABNORMAL LOW (ref 137–147)
TOTAL BILIRUBIN: 0.5 mg/dL (ref 0.2–1.3)

## 2022-10-01 LAB — CBC: WBC COUNT: 7.2 10*3/uL (ref 4.5–11.0)

## 2022-10-01 LAB — PHOSPHORUS: PHOSPHORUS: 3.4 mg/dL — ABNORMAL LOW (ref 2.0–4.5)

## 2022-10-01 LAB — PROTIME INR (PT): PROTIME: 11 s — ABNORMAL LOW (ref 10.2–12.9)

## 2022-10-01 LAB — MAGNESIUM: MAGNESIUM: 1.7 mg/dL — ABNORMAL LOW (ref 1.6–2.6)

## 2022-10-01 LAB — IONIZED CALCIUM: IONIZED CALCIUM: 1 MMOL/L (ref 1.0–1.3)

## 2022-10-01 LAB — LACTIC ACID(LACTATE): LACTIC ACID: 1.6 MMOL/L (ref 0.5–2.0)

## 2022-10-01 LAB — PTT (APTT): PTT: 28 s (ref 24.0–36.5)

## 2022-10-01 MED ORDER — LEVETIRACETAM 500 MG PO TAB
500 mg | Freq: Two times a day (BID) | ORAL | 0 refills | Status: DC
Start: 2022-10-01 — End: 2022-10-03
  Administered 2022-10-02 (×2): 500 mg via ORAL

## 2022-10-01 MED ORDER — THIAMINE MONONITRATE (VIT B1) 100 MG PO TAB
100 mg | Freq: Every day | ORAL | 0 refills | Status: DC
Start: 2022-10-01 — End: 2022-10-03
  Administered 2022-10-02 (×2): 100 mg via ORAL

## 2022-10-01 MED ORDER — IOHEXOL 350 MG IODINE/ML IV SOLN
100 mL | Freq: Once | INTRAVENOUS | 0 refills | Status: CP
Start: 2022-10-01 — End: ?
  Administered 2022-10-01: 100 mL via INTRAVENOUS

## 2022-10-01 MED ORDER — PANTOPRAZOLE 40 MG PO TBEC
40 mg | Freq: Every day | ORAL | 0 refills | Status: DC
Start: 2022-10-01 — End: 2022-10-03
  Administered 2022-10-02: 02:00:00 40 mg via ORAL

## 2022-10-01 MED ORDER — CARVEDILOL 25 MG PO TAB
25 mg | Freq: Two times a day (BID) | ORAL | 0 refills | Status: DC
Start: 2022-10-01 — End: 2022-10-02

## 2022-10-01 MED ORDER — SODIUM CHLORIDE 0.9 % IJ SOLN
50 mL | Freq: Once | INTRAVENOUS | 0 refills | Status: CP
Start: 2022-10-01 — End: ?
  Administered 2022-10-01: 50 mL via INTRAVENOUS

## 2022-10-01 MED ORDER — OXYCODONE 5 MG PO TAB
5 mg | ORAL | 0 refills | Status: DC | PRN
Start: 2022-10-01 — End: 2022-10-03
  Administered 2022-10-02 (×2): 5 mg via ORAL

## 2022-10-01 MED ORDER — MAGNESIUM SULFATE IN WATER 4 GRAM/50 ML (8 %) IV PGBK
4 g | Freq: Once | INTRAVENOUS | 0 refills | Status: CP
Start: 2022-10-01 — End: ?
  Administered 2022-10-02: 01:00:00 4 g via INTRAVENOUS

## 2022-10-01 MED ORDER — PHENOBARBITAL SODIUM 130 MG/ML IJ SOLN
130 mg | Freq: Once | INTRAVENOUS | 0 refills | Status: CP
Start: 2022-10-01 — End: ?
  Administered 2022-10-02: 01:00:00 130 mg via INTRAVENOUS

## 2022-10-01 MED ORDER — FOLIC ACID 1 MG PO TAB
1 mg | Freq: Every day | ORAL | 0 refills | Status: DC
Start: 2022-10-01 — End: 2022-10-03
  Administered 2022-10-02 (×2): 1 mg via ORAL

## 2022-10-01 MED ORDER — LOSARTAN 25 MG PO TAB
50 mg | Freq: Two times a day (BID) | ORAL | 0 refills | Status: DC
Start: 2022-10-01 — End: 2022-10-03

## 2022-10-01 MED ORDER — LEVOTHYROXINE 88 MCG PO TAB
88 ug | Freq: Every day | ORAL | 0 refills | Status: DC
Start: 2022-10-01 — End: 2022-10-03
  Administered 2022-10-02: 11:00:00 88 ug via ORAL

## 2022-10-01 MED ORDER — TIZANIDINE 2 MG PO TAB
2 mg | ORAL | 0 refills | Status: DC | PRN
Start: 2022-10-01 — End: 2022-10-02
  Administered 2022-10-02: 03:00:00 2 mg via ORAL

## 2022-10-01 MED ORDER — POTASSIUM CHLORIDE 20 MEQ PO TBTQ
80 meq | Freq: Once | ORAL | 0 refills | Status: CP
Start: 2022-10-01 — End: ?
  Administered 2022-10-02: 01:00:00 80 meq via ORAL

## 2022-10-01 MED ORDER — ACETAMINOPHEN 500 MG PO TAB
1000 mg | ORAL | 0 refills | Status: DC | PRN
Start: 2022-10-01 — End: 2022-10-03
  Administered 2022-10-02 (×3): 1000 mg via ORAL

## 2022-10-01 MED ORDER — FENTANYL CITRATE (PF) 50 MCG/ML IJ SOLN
25 ug | INTRAVENOUS | 0 refills | Status: DC | PRN
Start: 2022-10-01 — End: 2022-10-02

## 2022-10-01 NOTE — Progress Notes
HPI: 32 male - fall 4 days ago down 1 cement step (head involved) has had a few falls since then most recently yesterday but not head involvement   Neuro intact   Onlycomplaint HA  Imaging was ordered outpatient and d/t results ended up in the ER @ [facility]   May need to be observed and repeat CT head     Only complaint HA when walking    PMH:Not on blood thinners    Vital Signs: 160/95  100% SP02 RA  Afebrile   Resp even unlabored   GCS 15 no neuro defecits     Imaging / Procedures: Outpt CT head epidural hematoma blood in subdural space-done 8/12 @ 1219  Images clouded     Labs: pending      Meds / Drips: none    Reason for Transfer: Do not have neurosurgery at sending   Hosptialist will not admit with repeat imaging

## 2022-10-01 NOTE — H&P (View-Only)
Trauma History and Physical     HPI: Johnny Mosley is a 72 y.o. with past medical history of alcoholism in remission, dyslipidemia, hypothyroidism, hypertension, back surgery who presented to an outside facility for head bleed after a ground-level fall.  The patient received a CT head at the outside facility which demonstrated multiple areas with concern for bleeding.  He was subsequently sent to the ER and eventually [facility] for higher level of care.  The original fall happened 6 days ago.  Since this fall, he has had 1 episode of nausea and vomiting with dizziness.  He reports a headache while he is walking.  The patient denies any other injuries or concerns at this time.  Of note, during the interim of his initial fall 6 days ago, he did have chiropractic manipulation.  He denies any blood thinners and has no other complaints at this time.    PMH:   Past Medical History:   Diagnosis Date    Dyslipidemia     Essential hypertension     Foot drop, left foot     H/O alcohol abuse     H/O carpal tunnel syndrome     Hypothyroid        PSH:   Surgical History:   Procedure Laterality Date    COLONOSCOPY      HX CARPAL TUNNEL RELEASE      HX WISDOM TEETH EXTRACTION         Meds:   No current facility-administered medications on file prior to encounter.     Current Outpatient Medications on File Prior to Encounter   Medication Sig Dispense Refill    carvediloL (COREG) 25 mg tablet Take one tablet by mouth twice daily. take with food  Indications: high blood pressure 180 tablet 3    levothyroxine (SYNTHROID) 88 mcg tablet Take one tablet by mouth daily 30 minutes before breakfast.      losartan (COZAAR) 50 mg tablet Take one tablet by mouth twice daily. Indications: high blood pressure 180 tablet 3    MEN'S MULTI-VITAMIN PO Take 1 tablet by mouth daily.      omeprazole DR (PRILOSEC) 40 mg capsule Take one capsule by mouth every 48 hours.      sildenafiL (VIAGRA) 50 mg tablet Take one tablet by mouth as Needed for Erectile dysfunction.       Allergies: No Known Allergies  SH:   Social History     Socioeconomic History    Marital status: Married   Tobacco Use    Smoking status: Former     Current packs/day: 0.00     Types: Cigarettes     Quit date: 08/01/1978     Years since quitting: 44.1    Smokeless tobacco: Current     Types: Chew   Substance and Sexual Activity    Alcohol use: Yes     Alcohol/week: 6.0 standard drinks of alcohol     Types: 6 Cans of beer per week    Drug use: Never    Sexual activity: Yes     Partners: Female       Physical exam:    Primary survey:   Airway patent to voice, trachea midline   Breath sounds equal bilaterally, equal chest rise   Carotid, radial, femoral, DP palpable bilaterally, heart sounds clear      Secondary survey:   HEENT: PERRLA at 3mm bilat, no skull/maxillofacial bony deformities or TTP, no scalp lacs/abrasions, no otorrhea/rhinorrhea  CHEST: no clavicular, sternal  or thoracic TTP/bony deformities, bilateral axillae clear   ABD: s/nt/nd   BACK: no C,T,L,S spine TTP or step-off deformities, bilateral flanks clear  PELVIS: no obvious instability, perineum clear, normal rectal tone with no gross blood   EXT: no obvious long bone deformities, abrasions or lacs to bilateral upper and lower extremities     Patient Active Problem List    Diagnosis Date Noted    Trauma 10/01/2022    Preop cardiovascular exam 09/03/2017    Tobacco chew use 08/01/2017    ETOH abuse 08/01/2017    Class 1 obesity due to excess calories without serious comorbidity with body mass index (BMI) of 30.0 to 30.9 in adult 08/01/2017    SOB (shortness of breath) on exertion 08/01/2017    Dyslipidemia 07/31/2017    Essential hypertension 07/31/2017    Hypothyroidism 07/31/2017       Assessment & Plan:   Johnny Mosley is a 72 y.o. male who presents to [facility] from an outside facility after suffering a ground-level fall 6 days ago.  He subsequently received a CT head that demonstrated a parietal subdural hematoma and we will plan to follow with serial CT heads to ensure stabilization.    Labs:    Complete Blood Counts   Recent Labs     10/01/22  1724   HGB 13.6   HCT 39.9*   WBC 7.2   PLTCT 311   MCV 99.0   MCH 33.7   MCHC 34.0   RDW 14.4   MPV 7.5     No results for input(s): NEUT, LYMPH, MONO, POIK, OVAL, PLTEST in the last 72 hours.    Invalid input(s): EOSINOPHIL     Chemistry Panel   No results for input(s): NA, K, CL, CO2, BUN, CR, GLU, GAP, GFR, MG, CA, PO4 in the last 72 hours.  No results for input(s): ALKPHOS, AST, ALT, TOTPROT, TOTBILI, ALBUMIN, AMYLASE, LIPASE in the last 72 hours.     Coagulation Studies   No results for input(s): PTT, INR in the last 72 hours.       Ileana Roup, MD  Electronically Signed  10/01/2022 5:55 PM  Trauma Team Pager: [phone]

## 2022-10-01 NOTE — Consults
Neurosurgery Consult History and Physical Note      Admission Date: 10/01/2022                                                LOS: 0 days    Reason for Consult:  Epidural hematoma      Consult type: Co-Management w/Signed Orders    Consulting Physician: Neurosurgery Attendings: Maryellen Pile, MD    Requesting Physician: SICU    Assessment/Plan:  Johnny Mosley is a 72 y.o. male s/p GLF 6 days ago who under went outpatient [facility] and was transferred to [facility] after findings of acute SDH. CTH demonstrates R acute SDH without mass effect or midline shift and trace SAH within the R temporal lobe. Patient is neurologically intact on exam.     - No acute neurosurgical intervention   - Hold all anticoagulation  - SBP < 160   - CT at 6 hours for stability  - INR goal < 1.6 and Plt goal > 80, recommend TEG in setting of alcohol abuse   - Keppra 500 BID x 7 days   - Q1H neuro checks  - Please call with any change in neuro exam    - Patient and plan discussed with attending Dr. Fonda Kinder, MD   Neurological Surgery    Please page [phone] with questions.  _____________________________________________________________________    History of Present Illness:   Johnny Mosley is a 72 y.o.   male with past medical history of alcoholism (currently drinks 6 beers/day), dyslipidemia, hypothyroidism, hypertension, back surgery who presented to an outside facility after ground-level fall.  The patient received a CT head which demonstrated SDH.  He was subsequently sent to the ER and eventually [facility] for higher level of care.  The original fall happened 6 days ago, he had two additional falls last night due to dizziness. Additionally he had one episode of nausea and vomiting. He reports intermittent blurry vision, dizziness and feeling unsteady on his feet. Reports headache. Patient denies any anticoagulation or antiplatelet medication.         Past Medical History:  Past Medical History:   Diagnosis Date    Dyslipidemia Essential hypertension     Foot drop, left foot     H/O alcohol abuse     H/O carpal tunnel syndrome     Hypothyroid        Past Surgical History:  Surgical History:   Procedure Laterality Date    COLONOSCOPY      HX CARPAL TUNNEL RELEASE      HX WISDOM TEETH EXTRACTION         Social History:  Social History     Tobacco Use    Smoking status: Former     Current packs/day: 0.00     Types: Cigarettes     Quit date: 08/01/1978     Years since quitting: 44.1    Smokeless tobacco: Current     Types: Chew   Substance Use Topics    Alcohol use: Yes     Alcohol/week: 6.0 standard drinks of alcohol     Types: 6 Cans of beer per week    Drug use: Never       Family History:  Family History   Problem Relation Name Age of Onset    Heart Attack Father  Garold     Coronary Artery Disease Father Garold     Hypertension Father Garold     Sudden Cardiac Death Father Garold     Coronary Artery Disease Brother Mert     Heart Attack Brother Mert     Hypertension Brother Mert     Diabetes Brother Mert     Heart Attack Brother      Hypertension Brother      Cancer Sister Cricket        Allergies:    Patient has no known allergies.    Medications:  PTA:  Current Outpatient Medications   Medication Instructions    carvediloL (COREG) 25 mg, Oral, TWICE DAILY, take with food    levothyroxine (SYNTHROID) 88 mcg, Oral, DAILY BEFORE BREAKFAST    losartan (COZAAR) 50 mg, Oral, TWICE DAILY    MEN'S MULTI-VITAMIN PO 1 tablet, Oral, DAILY    omeprazole DR (PRILOSEC) 40 mg capsule 1 capsule, Oral, EVERY 48 HOURS    sildenafiL (VIAGRA) 50 mg, Oral, AS NEEDED        Inpatient:  Scheduled Meds:[Held by Provider] carvediloL (COREG) tablet 25 mg, 25 mg, Oral, BID  levETIRAcetam (KEPPRA) tablet 500 mg, 500 mg, Oral, BID  levothyroxine (SYNTHROID) tablet 88 mcg, 88 mcg, Oral, QDAY 30 min before breakfast  [Held by Provider] losartan (COZAAR) tablet 50 mg, 50 mg, Oral, BID  magnesium sulfate   4 g/50 mL IVPB, 4 g, Intravenous, ONCE  pantoprazole DR (PROTONIX) tablet 40 mg, 40 mg, Oral, QDAY(21)  PHENobarbital injection 130 mg, 130 mg, Intravenous, ONCE  potassium chloride SR (K-DUR) tablet 80 mEq, 80 mEq, Oral, ONCE    Continuous Infusions:  PRN and Respiratory Meds:acetaminophen Q6H PRN, fentaNYL citrate PF Q1H PRN, oxyCODONE Q6H PRN, tiZANidine Q8H PRN      Physical Exam:  Vital Signs:  Last Filed in 24 hours Vital Signs:  24 hour Range    Temp: 36.9 ?C (98.5 ?F) (08/12 1720)  O2 Device: None (Room air) (08/12 1822)  Height: 172.7 cm (5' 8) (08/12 1720) Temp:  [36.9 ?C (98.5 ?F)]   O2 Device: None (Room air)     General appearance: No acute distress    Neurologic Exam:   Mental Status: Awake, alert and oriented x 4, fluent speech, normal cognition  Pupils: Pupils equal round and reactive to light  Cranial Nerves: CN II-XII individually tested and found to be intact, gag not tested  Motor: Full symmetric strength throughout  Normal muscle bulk and tone  No pronator drift  Sensation: Sensation intact to light touch throughout    LabTests:  Hematology:    Lab Results   Component Value Date    HGB 13.6 10/01/2022    HCT 39.9 10/01/2022    PLTCT 311 10/01/2022    WBC 7.2 10/01/2022    MCV 99.0 10/01/2022    MCH 33.7 10/01/2022    MCHC 34.0 10/01/2022    MPV 7.5 10/01/2022    RDW 14.4 10/01/2022     General Chemistry:   Lab Results   Component Value Date    NA 133 10/01/2022    K 3.2 10/01/2022    CL 98 10/01/2022    CO2 24 10/01/2022    BUN 8 10/01/2022    CR 0.89 10/01/2022    GLU 100 10/01/2022    OBSCA 1.08 10/01/2022    CA 7.8 10/01/2022    MG 1.7 10/01/2022    PO4 3.4 10/01/2022     General Chemistry:  Lab Results   Component Value Date    GAP 11 10/01/2022    ALBUMIN 3.7 10/01/2022    LACTIC 1.6 10/01/2022    TOTBILI 0.5 10/01/2022    TOTPROT 6.0 10/01/2022    AST 23 10/01/2022    ALT 18 10/01/2022    ALKPHOS 49 10/01/2022       Radiology and other Diagnostics Review:    CTH demonstrates R acute SDH without mass effect and trace SAH within the R temporal lobe.     Varney Daily, MD  10/01/2022  Neurological Surgery

## 2022-10-02 ENCOUNTER — Inpatient Hospital Stay: Admit: 2022-10-02 | Discharge: 2022-10-02 | Payer: BC Managed Care – PPO

## 2022-10-02 ENCOUNTER — Encounter: Admit: 2022-10-02 | Discharge: 2022-10-02 | Payer: BC Managed Care – PPO

## 2022-10-02 DIAGNOSIS — G629 Polyneuropathy, unspecified: Secondary | ICD-10-CM

## 2022-10-02 DIAGNOSIS — E039 Hypothyroidism, unspecified: Secondary | ICD-10-CM

## 2022-10-02 DIAGNOSIS — I10 Essential (primary) hypertension: Secondary | ICD-10-CM

## 2022-10-02 DIAGNOSIS — K219 Gastro-esophageal reflux disease without esophagitis: Secondary | ICD-10-CM

## 2022-10-02 DIAGNOSIS — E785 Hyperlipidemia, unspecified: Secondary | ICD-10-CM

## 2022-10-02 DIAGNOSIS — R001 Bradycardia, unspecified: Secondary | ICD-10-CM

## 2022-10-02 DIAGNOSIS — Z87891 Personal history of nicotine dependence: Secondary | ICD-10-CM

## 2022-10-02 DIAGNOSIS — F1021 Alcohol dependence, in remission: Secondary | ICD-10-CM

## 2022-10-02 DIAGNOSIS — G8911 Acute pain due to trauma: Secondary | ICD-10-CM

## 2022-10-02 DIAGNOSIS — S065XAA Traumatic subdural hemorrhage with loss of consciousness status unknown, initial encounter (HCC): Secondary | ICD-10-CM

## 2022-10-02 DIAGNOSIS — R350 Frequency of micturition: Secondary | ICD-10-CM

## 2022-10-02 LAB — URINALYSIS, MICROSCOPIC

## 2022-10-02 LAB — CBC
HEMATOCRIT: 38 % — ABNORMAL LOW (ref 40–50)
HEMOGLOBIN: 13 g/dL — ABNORMAL LOW (ref 13.5–16.5)
MCV: 101 FL — ABNORMAL HIGH (ref 80–100)
MPV: 7.2 FL (ref 7–11)
PLATELET COUNT: 285 10*3/uL (ref >60)
RBC COUNT: 3.8 M/UL — ABNORMAL LOW (ref 4.4–5.5)
WBC COUNT: 6.7 10*3/uL (ref 4.5–11.0)

## 2022-10-02 LAB — ALCOHOL LEVEL: ALCOHOL: <10 mg/dL

## 2022-10-02 LAB — URINALYSIS DIPSTICK

## 2022-10-02 LAB — PHOSPHORUS: PHOSPHORUS: 3.3 mg/dL (ref 2.0–4.5)

## 2022-10-02 LAB — BASIC METABOLIC PANEL: SODIUM: 133 MMOL/L — ABNORMAL LOW (ref 137–147)

## 2022-10-02 LAB — TSH WITH FREE T4 REFLEX: TSH: 2.1 uU/mL (ref 0.35–5.00)

## 2022-10-02 LAB — MAGNESIUM: MAGNESIUM: 2.3 mg/dL (ref 1.6–2.6)

## 2022-10-02 LAB — VITAMIN B12: VITAMIN B12: 735 pg/mL (ref 180–914)

## 2022-10-02 MED ORDER — LEVETIRACETAM 500 MG PO TAB
500 mg | ORAL_TABLET | Freq: Two times a day (BID) | ORAL | 0 refills | 90.00000 days | Status: AC
Start: 2022-10-02 — End: ?
  Filled 2022-10-02: qty 12, 6d supply, fill #1

## 2022-10-02 MED ORDER — PERFLUTREN LIPID MICROSPHERES 1.1 MG/ML IV SUSP
1-10 mL | Freq: Once | INTRAVENOUS | 0 refills | Status: CP | PRN
Start: 2022-10-02 — End: ?
  Administered 2022-10-02: 21:00:00 6 mL via INTRAVENOUS

## 2022-10-02 MED ORDER — ENOXAPARIN 30 MG/0.3 ML SC SYRG
30 mg | Freq: Two times a day (BID) | SUBCUTANEOUS | 0 refills | Status: DC
Start: 2022-10-02 — End: 2022-10-03

## 2022-10-02 MED ORDER — SODIUM CHLORIDE 0.9 % IJ SOLN
10 mL | Freq: Once | INTRAVENOUS | 0 refills | Status: CP
Start: 2022-10-02 — End: ?
  Administered 2022-10-02: 21:00:00 10 mL via INTRAVENOUS

## 2022-10-02 NOTE — Progress Notes
All belongings gathered and placed in belonging bag with patient labels at bedside.  The bag(s) contain(s) the following:    Clothing: 1 pair of pants and belt   Shoes: one pair of gray shoes  Other: Personal cell phone    All belongings placed in 1 bag(s).    Belongings disposition:    with patient at bedside.

## 2022-10-02 NOTE — Progress Notes
10/02/22 1322   Critical Care Vitals Adult   Pulse (!) 36  (Alvarado, A. MD aware)   Respirations 15 PER MINUTE     The patient intermittently has been bradycardic into the 30's overnight and today. The SICU team is aware. The pt remains hemodynamically stable and does not sustain for longer than a few seconds with a heart rate in the 30's.

## 2022-10-02 NOTE — Discharge Instructions - Appointments
Thank you for letting us take care of you!  1. You were seen for a traumatic head injury we did do stability scanning which was normal.  We also took a scan of your face that did not show any fracture.  You will need to follow-up outpatient with the TBI clinic.  We also had you see cardiology due to your bradycardia.  They recommended an echo which we completed here at the hospital.  They want you to follow-up with your cardiologist outpatient and we will hold your Coreg until you follow-up with your cardiologist.  I do also recommend that you quit drinking and smoking these can increase your risk of having other problems to your health.  2. Follow-up with your cardiologist and the TBI clinic.  You will be called for appointments.  3. You were given a prescription for Keppra take this twice a day for the next 6 days.  4. Continue all of your home medications.   5. Watch for signs and symptoms like altered mental status, worsening of symptoms, chest pain, shortness of breath, multiple episodes of vomiting, or any other concerns.  6. Please return immediately if you get worse, you don?t get better or you develop any new or concerning symptoms.       Outside [place] Area:  [email or web address]    [place] Substance Abuse Resources:  Print production planner   [facility]ult [facility]) - [facility]  [phone]                        Admissions: [phone] [address], [place] Adults, Men Long-term residential program assists up to 135 men, with treatable handicaps, drug dependence, alcohol addiction or other problems, at one time. Residents receive addiction counseling and are taught skills and given responsibilities that will help them return to community living. 180-day minimum, programs can last 6-12 months.   Al-Anon  24 hour ([place]):                    [phone]       [facility]: [phone] Numerous meeting locations throughout [place] and beyond. Adults and adolescents. No dues or fees. At Al-Anon Family Group meetings, the friends and family members of problem drinkers share their experiences and learn how to apply the principles of the Al-Anon program to their individual situations. Younger family members and friends attend [employer].   Alcoholics Anonymous - [employer]  [phone]    (24 hours/day) [address] [place], [place]  Adults and adolescents. No dues or fees. An international fellowship of men and women who have had a drinking problem. It is nonprofessional, self-supporting, multiracial, apolitical, and available almost everywhere. There are no age or education requirements. Membership is open to anyone who wants to do something about his or her drinking problem.   [facility]  [phone] [address], [place] Adults, family.  Sliding scale. Assessment, referral, court-monitoring, individual/group treatment, family/couples counseling, recovery monitoring, intervention, sober companion.  [facility]   Main: [phone]  Crisis line:              [phone] [address] [place]                   Multiple locations in [place], [place], area. Adults, adolescents Residential services, day treatment, outpatient services, adolscent substance abuse program, case management, individual and family therapy, group education/therapy, continuing care.    [facility]    [phone] [address]          [place], [place]  Adults.  Initial assessment is no charge.   Inpatient detox from alcohol, benzos, and opiates.  Dual diagnosis inpatient treatment for those with primary mental health disorder and detox needs for meth, marijuana, hallucinogens, and K2. Cognitive behavioral therapy, med management, holistic therapies, and internet based recovery tools.   [facility]  Level I, II [facility] Bishop): [phone]       [facility], inpt women): [phone]       [facility], inpt men): [phone]       Women's Recovery [facility]): [phone] Multiple [place] locations, [place], [place], [place]. Adults, adolescents, pregnant women.  Medicaid and most insurance (no Medicare).  [place] residents whose income is less than 200% of the federal poverty level may have treatment reimbursed.  Payment arrangements may be made using a sliding fee scale. Outpatient treatment, individual/group counseling, assessment, court ordered treatment, non-medical/social detox, intensive short-term residential stays, extended residential, intensive outpatient treatment with housing, women's recovery center.

## 2022-10-02 NOTE — Case Management (ED)
CMA Note:       Added alcohol abuse resources in pt AVS per request from Juanita Laster, [facility].     Carlynn Spry  Case Management Assistant

## 2022-10-02 NOTE — Progress Notes
Brief Neurosurgery Progress Note    Pertinent imaging reviewed.    - No acute neurosurgical intervention  - 6hr CTH stable. No further surveillance imaging necessary at this time.  - Okay for Q4H neurochecks and floor status   - Okay to start DVT ppx with SQH/Lovenox 24 hours from stable scan   - Continue Keppra 500 BID x 7 days  - Follow-up in TBI Clinic with Dr. Maxine Glenn .       We will arrange follow-up appointment with our clinic and scans. Our clinic may be reached at [phone] or [phone] for [facility].      Thank you for involving neurosurgery in this patient's care. We will sign off at this time. Please reach out to [phone] if there are further neurosurgical questions or concerns.

## 2022-10-02 NOTE — Case Management (ED)
Discharge Planning Screen      Primary Case Management Team:   Social Work: Fidela Juneau, Amie Critchley  Nurse Case Manager: Juanita Laster, Amie Critchley  After-Hours: Please dial hospital operator and request On-Call Case Manager    Initial Huddle Date or Communication with Physician Team: 10/02/2022  Expected Discharge Date: 10/03/2022   Is Patient Medically Stable: No, Please explain: cardiac workup  -------------------------------------------------------------------------------------------------------------------------    Patient was admitted for fall and SDH on 10/01/2022.    Using sources of EMR, RN staff, and/or huddle discussion and the criteria below, patient was screened for potential discharge planning needs:    Team anticipates post-acute care needs that will exceed patient/caregiver capacity to arrange or manage independently: No  If Yes, full CM assessment is required.    Prior to this episode of care (including at outside hospital), patient's residence was: Family home    Patient is alert and oriented, and likely to remain that way at discharge: Yes   If No, patient  (a) Is at cognitive baseline, (b) has an established caregiver, and (c) home routine is unlikely to change at discharge: N/A    If any answer to (a), (b), or (c) is No, full assessment is required.    Patient is independent in functioning, and likely to remain that way at discharge: Yes   If No, patient   (a) Is at functional baseline, (b) has an established caregiver or adequate assistance, and (c) home routine is unlikely to change at discharge: N/A  If any answer to (a), (b), or (c) is No, full assessment is required.    PSYCHOSOCIAL NEEDS:  If the patient has unstable psychosocial needs and has not had full CM assessment in the past 30 days, full assessment is required.    CM spoke with or attempted to speak with patient today to determine willingness to address behavioral/social/domestic need:  placed alcohol abuse resources in AVS  Outcomes of intervention:   The patient is open to addressing these needs during this admission: Yes .   Further results were: Patient accepted resources: resources placed in AVS..    Other Case Management Notes: none    Plan: Likely discharge to home without Case Management needs. Case Management Team will continue to monitor daily for development of needs. Should Case Management needs arise, please call or Voalte Case Management Team noted above. If after hours or on the weekend, please dial hospital operator and request the on-call Case Manager.      Juanita Laster, BSN, RN  Integrated Nurse Case Manager  [facility] [employer] [facility]  Phone [phone] - Fax [phone] - [email or web address]  [address] [place], [place] [address]

## 2022-10-02 NOTE — Progress Notes
Chaplain Note:    Admit Date: 10/01/2022         Reason for Visit:  Chaplain met with patient while rounding on the unit.    Faith/Religion: Pt confirmed that he is [place] and has gone to [employer] in [place].    Source of Purpose/Meaning:  Pt shared that he is retired and has become lazy.  Pt enjoys hunting and fishing and doesn't even do that as much anymore.  We discussed deer season.    Worries/Concerns/Struggles:   Pt mentioned a brain bleed yet said that the doctors did not seem to worried about it now.  No other concerns were expressed besides a bump on his right cheek.     Support System:  Pt shared that his wife and daughter are here for him.    Interventions/Plan:   I introduced myself and my role as a chaplain to patient.  Pt shared about his injuries and feels he is doing pretty well.  Pt is not sure when he might discharge yet hopes it will be soon.  No spiritual needs were expressed. Chaplaincy will remain available, if needed.    The On-Call Chaplain is available on Voalte or can be paged via the switchboard [phone]) for urgent and emergent needs.   The Spiritual Care team responds to other requests within 24-hours when submitted as a Chaplain Consult in O2.            Date/Time:                      User:                                      10/02/2022 12:36 PM Antony Salmon, M.Div, [facility]      PCU 3 PCU

## 2022-10-02 NOTE — Consults
General Medicine Initial Consult      Johnny Mosley  Admission Date:  10/01/2022                       Assessment     Principal Problem:    Trauma      Johnny Mosley is a 72 y.o. male with  past medical history of alcoholism (currently drinks 6 beers/day), dyslipidemia, hypothyroidism, hypertension, GERD, ED, sciatica s/p back surgery who presented to an outside facility after ground-level fall and found to have subdural hematoma.  Hematoma has been stable on serial imaging and followed by neurosurgery.    Reason for consult : Geriatric trauma  Type of consult: co-management with orders.      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  -difficulty going up and down stairs without handrails  -Chronic heavy alcohol use daily  -Stable subdural hematoma followed by neurosurgery    Problem 2 chronic alcohol and tobacco use  -Patient drinks 6 beers nightly and uses chewing tobacco can every 3 days  -Patient not interested in cessation at this time, describes no adverse effects to current substance use    Problem 3 urinary frequency  -Reports increased urinary frequency without dysuria, hematuria or other change  -Incidental bladder wall inflammation noted on CT scan    Problem 4 chronic medical conditions  -Patient follows regularly with primary care physician in [place].  -Chronic medical conditions currently well-controlled on medication regimen.  -Reports up-to-date on regular screening exams.      Recommendations     Will order B12 and thiamine level in setting of peripheral neuropathy and heavy alcohol use leading to a fall.  Will order urinalysis given frequency symptoms and inflammation seen on CT.  Recommend continued follow-up with PCP physician and encouragement of alcohol and tobacco use reduction.Johnny Roles, MD    Thank you for the consult.     Note: All patient care calls should be directed first to the primary service. If the primary service needs further assistance, contact the appropriate med consult service (Consults-1, Voalte or [phone];  Consults-2 , Voalte or [phone]) 24 hours a day to discuss the case.    ____________________________________________________________________________________    History of Present Illness:  Johnny Mosley is a 72 y.o. male with past medical history of alcoholism (currently drinks 6 beers/day), dyslipidemia, hypothyroidism, hypertension, GERD, ED, sciatica s/p back surgery who presented to an outside facility after ground-level fall.  The patient received a CT head which demonstrated SDH.  He was subsequently sent to the ER and eventually [facility] for higher level of care.  The original fall happened 7 days ago, he had two additional falls 8/11 due to dizziness. Additionally he had one episode of nausea and vomiting.    Patient reports he is unsure as to what exactly precipitated his fall.  He notes that since his back surgery a few years ago, he has had occasional left foot drop and mild left foot neuropathy.  He reports going up and down stairs is more difficult and always uses a handrail.  He is able to walk normally without a walker otherwise.  Patient does acknowledge that he currently uses alcohol and averages 6 beers per day in the evenings.  Does report that he has trouble with balance when he has been drinking.  Patient also reports he is  a current chew tobacco user and averages 1 can every 3 days.  Reports using alcohol and tobacco regularly since roughly 72 years of age.    Patient also notes increased urinary frequency recently without pain or other urinary symptoms.    Additional medical conditions include: GERD for which he takes omeprazole 40 mg every other day, hypertension taking Coreg 25 mg twice daily and losartan 50 mg twice daily, hypothyroidism taking Synthroid 88 daily, sildenafil as needed for ED.      Review of Systems negative except as above. Past Medical History:   Diagnosis Date    Dyslipidemia     Essential hypertension     Foot drop, left foot     H/O alcohol abuse     H/O carpal tunnel syndrome     Hypothyroid      Surgical History:   Procedure Laterality Date    COLONOSCOPY      HX CARPAL TUNNEL RELEASE      HX WISDOM TEETH EXTRACTION       Family History   Problem Relation Name Age of Onset    Heart Attack Father Johnny Mosley     Coronary Artery Disease Father Johnny Mosley     Hypertension Father Johnny Mosley     Sudden Cardiac Death Father Johnny Mosley     Coronary Artery Disease Brother Johnny Mosley     Heart Attack Brother Johnny Mosley     Hypertension Brother Johnny Mosley     Diabetes Brother Johnny Mosley     Heart Attack Brother      Hypertension Brother      Cancer Sister Johnny Mosley      Social History     Socioeconomic History    Marital status: Married   Tobacco Use    Smoking status: Former     Current packs/day: 0.00     Types: Cigarettes     Quit date: 08/01/1978     Years since quitting: 44.2    Smokeless tobacco: Current     Types: Chew   Substance and Sexual Activity    Alcohol use: Yes     Alcohol/week: 6.0 standard drinks of alcohol     Types: 6 Cans of beer per week    Drug use: Never    Sexual activity: Yes     Partners: Female      Immunizations (includes history and patient reported):   There is no immunization history on file for this patient.        Allergies:  Patient has no known allergies.    Medications:  Medications Prior to Admission   Medication Sig    carvediloL (COREG) 25 mg tablet Take one tablet by mouth twice daily. take with food  Indications: high blood pressure    levothyroxine (SYNTHROID) 88 mcg tablet Take one tablet by mouth daily 30 minutes before breakfast.    losartan (COZAAR) 50 mg tablet Take one tablet by mouth twice daily. Indications: high blood pressure    MEN'S MULTI-VITAMIN PO Take 1 tablet by mouth daily.    omeprazole DR (PRILOSEC) 40 mg capsule Take one capsule by mouth every 48 hours.    sildenafiL (VIAGRA) 50 mg tablet Take one tablet by mouth as Needed for Erectile dysfunction.         Physical Exam:  Vital Signs: Last Filed In 24 Hours Vital Signs: 24 Hour Range   BP: 118/74 (08/13 0700)  Temp: 36.7 ?C (98.1 ?F) (08/13 0400)  Pulse: 50 (08/13 0700)  Respirations: 14 PER MINUTE (08/13 0700)  SpO2: 93 % (08/13 0700)  O2 Device: None (Room air) (08/13 0300)  Height: 172.7 cm (5' 8) (08/12 1720) BP: (110-163)/(48-95)   Temp:  [36.6 ?C (97.9 ?F)-36.9 ?C (98.5 ?F)]   Pulse:  [50-73]   Respirations:  [11 PER MINUTE-27 PER MINUTE]   SpO2:  [92 %-99 %]   O2 Device: None (Room air)   Intensity Pain Scale (Self Report): 2 (10/01/22 2200)        Physical Exam  Vitals and nursing note reviewed.   Constitutional:       Appearance: Normal appearance.   HENT:      Head: Normocephalic and atraumatic.      Nose: Nose normal.      Mouth/Throat:      Mouth: Mucous membranes are moist.      Pharynx: Oropharynx is clear.   Eyes:      Extraocular Movements: Extraocular movements intact.      Conjunctiva/sclera: Conjunctivae normal.      Pupils: Pupils are equal, round, and reactive to light.   Cardiovascular:      Rate and Rhythm: Normal rate and regular rhythm.   Pulmonary:      Effort: Pulmonary effort is normal.      Breath sounds: Normal breath sounds.   Abdominal:      General: Abdomen is flat.      Palpations: Abdomen is soft.   Musculoskeletal:         General: Normal range of motion.      Comments: Mildly reduced sensation on the left lower foot.   Skin:     General: Skin is warm and dry.   Neurological:      General: No focal deficit present.      Mental Status: He is alert and oriented to person, place, and time.   Psychiatric:         Mood and Affect: Mood normal.            Lab/Radiology/Other Diagnostic Tests:  Pertinent labs reviewed       Radiology: Pertinent radiology reviewed.    Johnny Roles, MD

## 2022-10-02 NOTE — Consults
CARDIOLOGY CONSULT NOTE      Johnny Mosley  Admission Date:  10/01/2022                     Assessment & Recs   Johnny Mosley is a 72 y.o. patient with the following problems:    Principal Problem:    Trauma      History of Present Illness: Johnny Mosley is a 72 y.o. male with a past medical history of alcohol use disorder, HLD, hypothyroidism, hypertension, GERD. Primary cardiologist is Nickolas Madrid, MD.     Patient was admitted for subdural hematoma after a fall on 10/01/2022.    On chart review, patient was last seen in cardiology clinic with Dr. Avie Arenas on 03/29/22. He had a previous ischemia workup in June 2019 which was negative. He does not have nay documented history of arrhythmia, or bradycardia. He has hypertension which is managed with carvedilol and losartan.     On discussion with patient this morning, he denies chest pain, palpitations, shortness of breath. He endorses a little bit of lightheadedness. Patient states he was working on something with his wife when he had a ground level fall. He did not report any prodromal symptoms prior.     Assessment:    Sinus bradycardia  - Developed bradycardia during admission with HR as low as 396 overnight on 08/12.  - EKG (08/12) demonstrated sinus bradycardia. No heart block.  - Low suspicion for OSA based on patient history.    Fall  - Upon discussion with patient, his fall does not appear to be related to a cardiac cause.  - There may be contribution from concurrent alcohol use.   Hypertension: Optimally controlled on current medical therapy.  Plan as below.  Dyslipidemia: LDL 106 on 03/09/21.  Not on statin therapy.  Elevated BMI: Body mass index is 29.3 kg/m?Marland Kitchen Weight loss, Cardiac Healthy diet, and exercise when patient can tolerate.    Hypothyroidism: TSH 2.48 on 03/09/21. PTA treated with Levothyroxine 88 mcg po daily.     Recommendations:  Continue to hold carvedilol as it may be contributing to his bradycardia.  Obtain updated echocardiogram  Patient will require external cardiac monitor on discharge.  Will arrange follow up with primary cardiologist Dr. Avie Arenas.  Please keep K above/= 4.0 and Mag above/= 2.0     Thank you for the opportunity to participate in the care of your patient.  Please call with questions or concerns.    Discussed with Cardiology attending.  Please see attestation by Dr. Gwen Pounds for final recommendations or changes to above plan.   Discussed with Dr. Mayford Knife with Primary team.     Teena Irani. Manson Passey, M.D. - PGY-2  Department of Family Medicine and [facility]   _____________________________________________________________________________    Reason for Consult:  Bradycardia, on Coreg, recurrent falls      CXR: No CXR obtained on admission    ECG:   All results for 12-Lead ECG this visit   ECG 12-LEAD    Collection Time: 10/01/22 11:54 PM   Result Value Status    VENTRICULAR RATE 52 Final    P-R INTERVAL 136 Final    QRS DURATION 96 Final    Q-T INTERVAL 472 Final    QTC CALCULATION (BAZETT) 438 Final    P AXIS 9 Final    R AXIS -7 Final    T AXIS 3 Final    Impression    Sinus bradycardia  Nonspecific ST  abnormality  Abnormal ECG  When compared with ECG of 29-Mar-2022 14:39,  Vent. rate has decreased BY  30 BPM  T wave inversion now evident in Inferior leads  Confirmed by Afaq, Mazhar ([id number]) on 10/02/2022 12:04:52 AM       Echocardiograms: Previous echo completed 08/13/2017. EF 60%. Mild LV diastolic dysfunction. Mild LA dilation. Normal valve structure.    Telemetry monitor: Sinus bradycardia. HR 30s-60s    Stress tests: Previous stress test 08/13/2017. Normal. No definite perfusion defects. Global left ventricular function within normal limits.       Cardiac catheterizations: No previous catheterizations.    Home Medications  Medications Prior to Admission   Medication Sig    carvediloL (COREG) 25 mg tablet Take one tablet by mouth twice daily. take with food  Indications: high blood pressure    levothyroxine (SYNTHROID) 88 mcg tablet Take one tablet by mouth daily 30 minutes before breakfast.    losartan (COZAAR) 50 mg tablet Take one tablet by mouth twice daily. Indications: high blood pressure    MEN'S MULTI-VITAMIN PO Take 1 tablet by mouth daily.    omeprazole DR (PRILOSEC) 40 mg capsule Take one capsule by mouth every 48 hours.    sildenafiL (VIAGRA) 50 mg tablet Take one tablet by mouth as Needed for Erectile dysfunction.       Current Medications  Scheduled Meds:ATROPINE 0.1 MG/ML IJ SYRG (Cabinet Override), , , NOW  enoxaparin (LOVENOX) syringe 30 mg, 30 mg, Subcutaneous, BID  folic acid (FOLVITE) tablet 1 mg, 1 mg, Oral, QDAY  levETIRAcetam (KEPPRA) tablet 500 mg, 500 mg, Oral, BID  levothyroxine (SYNTHROID) tablet 88 mcg, 88 mcg, Oral, QDAY 30 min before breakfast  [Held by Provider] losartan (COZAAR) tablet 50 mg, 50 mg, Oral, BID  pantoprazole DR (PROTONIX) tablet 40 mg, 40 mg, Oral, QDAY(21)  thiamine mononitrate (vit B1) tablet 100 mg, 100 mg, Oral, QDAY    Continuous Infusions:  PRN and Respiratory Meds:acetaminophen Q6H PRN, oxyCODONE Q6H PRN       Allergies  No Known Allergies    Past Medical History  Past Medical History:   Diagnosis Date    Dyslipidemia     Essential hypertension     Foot drop, left foot     H/O alcohol abuse     H/O carpal tunnel syndrome     Hypothyroid        Past Surgical History  Surgical History:   Procedure Laterality Date    COLONOSCOPY      HX CARPAL TUNNEL RELEASE      HX WISDOM TEETH EXTRACTION         Social History  Social History     Socioeconomic History    Marital status: Married   Tobacco Use    Smoking status: Former     Current packs/day: 0.00     Types: Cigarettes     Quit date: 08/01/1978     Years since quitting: 44.2    Smokeless tobacco: Current     Types: Chew   Substance and Sexual Activity    Alcohol use: Yes     Alcohol/week: 6.0 standard drinks of alcohol     Types: 6 Cans of beer per week    Drug use: Never    Sexual activity: Yes     Partners: Female Family History  Family History   Problem Relation Name Age of Onset    Heart Attack Father Garold     Coronary Artery Disease Father Carmin Muskrat  Hypertension Father Garold     Sudden Cardiac Death Father Garold     Coronary Artery Disease Brother Mert     Heart Attack Brother Mert     Hypertension Brother Mert     Diabetes Brother Mert     Heart Attack Brother      Hypertension Brother      Cancer Sister Cricket        Review of Systems    14 point ROS completed and negative except as noted per HPI    Physical Exam:  Vital Signs: Last Filed In 24 Hours Vital Signs: 24 Hour Range   BP: 135/79 (08/13 0855)  Temp: 36.6 ?C (97.8 ?F) (08/13 8413)  Pulse: 61 (08/13 0855)  Respirations: 14 PER MINUTE (08/13 0855)  SpO2: 96 % (08/13 0855)  O2 Device: None (Room air) (08/13 0855)  Height: 172.7 cm (5' 8) (08/12 1720) BP: (110-163)/(48-95)   Temp:  [36.6 ?C (97.8 ?F)-36.9 ?C (98.5 ?F)]   Pulse:  [50-73]   Respirations:  [11 PER MINUTE-27 PER MINUTE]   SpO2:  [92 %-99 %]   O2 Device: None (Room air)   Intensity Pain Scale (Self Report): 2 (10/01/22 2200)   Intake/Output Summary (Last 24 hours) at 10/02/2022 0931  Last data filed at 10/02/2022 0830  Gross per 24 hour   Intake 50 ml   Output 750 ml   Net -700 ml        Physical Exam    General Appearance: no acute distress, A&Ox3  HEENT: EOMI, MM-moist, post OP-clear  Neck: no JVD, no carotid bruits  RESP: lungs CTAB, no rales, rhonchi, or wheezing  Cardiac: regular rate and rhythm. Normal S1 & S2, no S3 or S4, no rub. no cardiac murmurs noted  Abdominal Exam: bowel sounds present, soft, non-tender, non-distended  Extremities: No LE edema. 2+ pulses  Skin: warm & intact.  no obvious lesions noted    Lab/Radiology/Other Diagnostic Tests:  CBC w/Diff    Lab Results   Component Value Date/Time    WBC 6.7 10/02/2022 03:13 AM    RBC 3.84 (L) 10/02/2022 03:13 AM    HGB 13.3 (L) 10/02/2022 03:13 AM    HCT 38.8 (L) 10/02/2022 03:13 AM    MCV 101.2 (H) 10/02/2022 03:13 AM    MCH 34.7 (H) 10/02/2022 03:13 AM    MCHC 34.3 10/02/2022 03:13 AM    RDW 14.2 10/02/2022 03:13 AM    PLTCT 285 10/02/2022 03:13 AM    MPV 7.2 10/02/2022 03:13 AM    No results found for: NEUT, ANC, LYMA, ALC, MONA, AMC, EOSA, AEC, BASA, ABC    Comprehensive Metabolic Profile    Lab Results   Component Value Date/Time    NA 133 (L) 10/02/2022 03:13 AM    K 4.3 10/02/2022 03:13 AM    CL 99 10/02/2022 03:13 AM    CO2 24 10/02/2022 03:13 AM    GAP 10 10/02/2022 03:13 AM    BUN 7 10/02/2022 03:13 AM    CR 0.81 10/02/2022 03:13 AM    GLU 93 10/02/2022 03:13 AM    Lab Results   Component Value Date/Time    CA 8.4 (L) 10/02/2022 03:13 AM    PO4 3.3 10/02/2022 03:13 AM    ALBUMIN 3.7 10/01/2022 05:24 PM    TOTPROT 6.0 10/01/2022 05:24 PM    ALKPHOS 49 10/01/2022 05:24 PM    AST 23 10/01/2022 05:24 PM    ALT 18 10/01/2022 05:24 PM  TOTBILI 0.5 10/01/2022 05:24 PM    GFR 66.3 02/03/2018 12:00 AM       Thyroid Studies    Lab Results   Component Value Date/Time    TSH 2.48 03/09/2021 12:00 AM    No results found for: TNI, CKMB, MYOGLB

## 2022-10-02 NOTE — Progress Notes
RT Adult Assessment Note    NAME:Johnny Mosley             MRN: [id number]             DOB:[birth year]          AGE: 72 y.o.  ADMISSION DATE: 10/01/2022             DAYS ADMITTED: LOS: 0 days    Criteria Not Met    Additional Comments:  Impressions of the patient: No complaints of chest discomfort at this time.  Resting on RA.  Intervention(s)/outcome(s): N/A  Patient education that was completed: Cough for initial assessment.  Recommendations to the care team: N/A    Vital Signs:  Pulse: 58  RR: 15 PER MINUTE  SpO2: 95 %  O2 Device: None (Room air)  Liter Flow:    O2%:      Breath Sounds:   Right Base Breath Sounds: Decreased  Left Base Breath Sounds: Decreased  All Breath Sounds: Clear (Implies normal)  Respiratory Effort:   Respiratory Effort/Pattern: Unlabored  Comments:

## 2022-10-03 ENCOUNTER — Encounter: Admit: 2022-10-03 | Discharge: 2022-10-03 | Payer: BC Managed Care – PPO

## 2022-10-03 ENCOUNTER — Ambulatory Visit: Admit: 2022-10-03 | Discharge: 2022-10-04 | Payer: BC Managed Care – PPO

## 2022-10-03 NOTE — Progress Notes
To our valued patient,     We have enrolled your heart monitor and requested it be sent to your home.  You should receive this within 2-3 business days. Please wear the monitor for 14 days. When you have completed the study, please remove the device, and mail it back to the company. Please call [employer] Customer Service at [phone] with questions about placement, troubleshooting, and insurance coverage. You can reach the [facility] ambulatory heart monitor team at [phone].      Your Heart Rhythm Management Team  Cardiovascular Medicine Department at [facility]              Ambulatory (External) Cardiac Monitor Enrollment Record     Placement Location: Home Enrollment  Clinic Location: MPB5  Vendor: [employer] ([employer])  Mobile Cardiac Telemetry (MCOT/MCT)?: No  Duration of Monitor (in days): 14  Monitor Diagnosis: Bradycardia (R00.1)  Secondary Monitor Diagnosis: -- (W01.0)  Ordering Provider: Ronn Melena, MD  AMB Monitor Serial Number: home  No data recorded    Start Time and Date: 10/03/22 6:48 AM   Patient Name: Johnny Mosley  DOB: 1950/07/01 12-09-51  MRN: [id number]  Sex: male  Mobile Phone Number: [phone] (mobile)  Home Phone Number:   Patient Address: [address] [place] [address]  Insurance Coverage: Rudean Curt FED EMP PROGRAM  Insurance ID: [id number]  Insurance Group #: [id number]  Insurance Subscriber: Haefner,CYNTHIA P  Implanted Cardiac Device Information: No results found for: [id number]      Patient instructed to contact company phone number on the monitor box with questions regarding billing, placement, troubleshooting.     Foye Clock Lillien Petronio    ____________________________________________________________    Clinic Staff:    Complete additional steps for documentation double check/Co-Sign.  In Follow-up, send chart upon closing encounter to P [facility] HRM AMBULATORY MONITORS    HRM Ambulatory Monitoring Team:  Schedule on appropriate template and check-in.   Clinic Placement Schedule on clinic location [facility] schedule   Home Enrollment Schedule on Home Enrollment schedule ([facility])   Given to patient in clinic for self-placement Schedule on Home Enrollment schedule ([facility])   Inpatient Schedule on [place] [facility] AMBULATORY MONITORING template   2. Please enroll with appropriate vendor.

## 2022-10-04 NOTE — Discharge Instructions - Pharmacy
Physician Discharge Summary      Name: Johnny Mosley  Medical Record Number: [id number]        Account Number:  [id number]  Date Of Birth:  1950/10/09                         Age:  72 years   Admit date:  10/01/2022                     Discharge date: 10/02/2022  6:11 PM    Attending Physician: Dr. Ronn Melena, MD               Service: Stevphen [place]    Physician Summary completed by: Emeline General, APRN-NP    Reason for hospitalization: fall; Trauma    Significant PMH:   Past Medical History:   Diagnosis Date    Dyslipidemia     Essential hypertension     Foot drop, left foot     H/O alcohol abuse     H/O carpal tunnel syndrome     Hypothyroid         Allergies: Patient has no known allergies.    Admission Lab/Radiology studies notable for:   2D + DOPPLER ECHO   Final Result      CT MAXIFACIAL/SINUS WO CONTRAST   Final Result         1.  Redemonstration of lobulated right cerebral convexity subdural hematoma better visualized 10/01/2022 CT head.   2.  No maxillofacial fracture.   3.  Mild pansinus inflammatory mucosal thickening.   4.  Multifocal dental caries.      By my electronic signature, I attest that I have personally reviewed the images for this examination and formulated the interpretations and opinions expressed in this report          Finalized by Lafonda Mosses, DO on 10/02/2022 11:31 AM. Dictated by Dorthula Rue, DO on 10/02/2022 11:10 AM.         CT SPINE CERVICAL WO CONTRAST   Final Result         CT head:      1. Stable acute subdural hemorrhage along the right cerebral convexity as well as minimal right parafalcine subdural hemorrhage.   2. Stable mild subarachnoid hemorrhage along the anterior right sylvian fissure. No new hemorrhage identified.      CT cervical spine:      1. No acute fracture or subluxation.   2. Degenerative changes in cervical spine with areas of at least moderate central canal narrowing and moderate to marked foraminal stenosis.          Finalized by Sherolyn Buba, M.D. on 10/01/2022 6:46 PM. Dictated by Sherolyn Buba, M.D. on 10/01/2022 6:35 PM.         CT HEAD WO CONTRAST   Final Result         CT head:      1. Stable acute subdural hemorrhage along the right cerebral convexity as well as minimal right parafalcine subdural hemorrhage.   2. Stable mild subarachnoid hemorrhage along the anterior right sylvian fissure. No new hemorrhage identified.      CT cervical spine:      1. No acute fracture or subluxation.   2. Degenerative changes in cervical spine with areas of at least moderate central canal narrowing and moderate to marked foraminal stenosis.          Finalized by Sherolyn Buba, M.D. on 10/01/2022  6:46 PM. Dictated by Sherolyn Buba, M.D. on 10/01/2022 6:35 PM.         CT CHEST W CONTRAST   Final Result         CHEST:   1. No evidence for acute thoracic trauma.   2. Trace pleural effusions.        ABDOMEN AND PELVIS:   1. No evidence for acute abdominopelvic trauma.   2. Circumferential bladder wall thickening, concerning for cystitis. Recommend correlation with urinalysis.   3. Distal colonic diverticulosis, without evidence for diverticulitis.            Finalized by Maida Sale, MD on 10/01/2022 7:07 PM. Dictated by Maida Sale, MD on 10/01/2022 6:55 PM.         CT ABD/PELV W CONTRAST   Final Result         CHEST:   1. No evidence for acute thoracic trauma.   2. Trace pleural effusions.        ABDOMEN AND PELVIS:   1. No evidence for acute abdominopelvic trauma.   2. Circumferential bladder wall thickening, concerning for cystitis. Recommend correlation with urinalysis.   3. Distal colonic diverticulosis, without evidence for diverticulitis.            Finalized by Maida Sale, MD on 10/01/2022 7:07 PM. Dictated by Maida Sale, MD on 10/01/2022 6:55 PM.                 Latest Ref Rng & Units 12/20/2017    12:00 AM 02/03/2018    12:00 AM 03/09/2021    12:00 AM 10/01/2022     5:24 PM 10/02/2022     3:13 AM   CMP   Sodium 137 - 147 MMOL/L 134  134  136     133  133    Potassium 3.5 - 5.1 MMOL/L 4.3  4.3  4.3     3.2  4.3    Chloride 98 - 110 MMOL/L 95  98  103     98  99    CO2 21 - 30 MMOL/L 27.0  26.0  24.0     24  24    Anion Gap 3 - 12 16  14  9     11  10     Blood Urea Nitrogen 7 - 25 MG/DL 47.8  29.5  62.1     8  7    Creatinine 0.4 - 1.24 MG/DL 3.08  6.57  8.46     9.62  0.81    Glucose 70 - 100 MG/DL 952  841  324     401  93    Calcium 8.5 - 10.6 MG/DL 9.7  9.7  9.2     7.8  8.4    Total Protein 6.0 - 8.0 G/DL  7.1  6.7     6.0     Albumin 3.5 - 5.0 G/DL  4.3  4.0     3.7     Alk Phosphatase 25 - 110 U/L  73  56     49     ALT (SGPT) 7 - 56 U/L  18  18     18      Total Bilirubin 0.2 - 1.3 MG/DL  0.27  2.53     0.5     EGFR Non African American   66.3           This result is from an external source.  Lab Results   Component Value Date    CR 0.81 10/02/2022           Latest Ref Rng & Units 09/05/2017    12:00 AM 02/03/2018    12:00 AM 03/09/2021    12:00 AM 10/01/2022     5:24 PM 10/02/2022     3:13 AM   CBC with Diff   White Blood Cells 4.5 - 11.0 K/UL 4.0  4.2  4.55     7.2  6.7    RBC 4.4 - 5.5 M/UL 4.23  4.77  4.22     4.03  3.84    Hemoglobin 13.5 - 16.5 GM/DL 91.4  78.2  95.6     21.3  13.3    Hematocrit 40 - 50 % 41.8  49.7  41.1     39.9  38.8    MCV 80 - 100 FL 98.6  104.0  97.4     99.0  101.2    MCH 26 - 34 PG 35.2  33.5  32.9     33.7  34.7    MCHC 32.0 - 36.0 G/DL 08.6  57.8  46.9     62.9  34.3    RDW 11 - 15 % 12.3  12.7   14.4  14.2    Platelet Count 150 - 400 K/UL 318  387  324     311  285    MPV 7 - 11 FL   9.6     7.5  7.2        This result is from an external source.       No results found for: HGBA1C, A1C    No results found for: Tawanna Cooler    Lab Results   Component Value Date/Time    TSH 2.10 10/02/2022 01:04 PM    TSH 2.48 03/09/2021 12:00 AM    TSH 2.17 02/03/2018 12:00 AM       Lab Results   Component Value Date    CHOL 186 03/09/2021    TRIG 95 03/09/2021    HDL 61 03/09/2021    LDL 106 (H) 03/09/2021    VLDL 19 03/09/2021    CHOLHDLC 3 03/09/2021       Lab Results   Component Value Date/Time    PROLACTIN 4.7 06/19/2018 01:15 PM    AST 23 10/01/2022 05:24 PM    ALT 18 10/01/2022 05:24 PM    PSA 0.51 03/09/2021 12:00 AM    BHCG <1 06/19/2018 01:15 PM       No results found for: VITD25, VITD125, BMWUXLKG4010, PTH, CROSSLINKS, SERXLINKS, NTX, NTXTELOPEPU, OSTEOCALCIN, HGH, IGF1    Brief Hospital Course:  The patient was admitted and the following issues were addressed during this hospitalization: (with pertinent details).    Johnny Mosley is a 72 y.o. male s/p GLF 6 days ago who under went outpatient [facility] and was transferred to [facility] after findings of acute SDH. CTH demonstrates R acute SDH without mass effect or midline shift and trace SAH within the R temporal lobe. Patient is neurologically intact on exam. SeizureHe was seen and evaluated for syncope and carvedilol was stopped and will follow-up outpatient with cardiology.  Otherwise he did fine worked with physical therapy and was subsequently discharged home with follow-up with the below noted consultants.    PT and OT was initiated and they was restarted on a diet and any indicated home medications. Oral pain medicine that was titrated to they needs.  Labs were routinely followed and electrolytes were replaced as indicated. Prior to discharge they were cleared by PT/OT, had adequate pain control, and return of bowel function. Appropriate follow-up was scheduled.      Discussed hospital visit with patient, answered all questions, and reviewed the plan for discharge to home and to follow up as an outpatient. The patient was verbally instructed to return to the ED immediately for further evaluation if there are any new or concerning symptoms, or worsening of current symptoms. The patient acknowledged understanding of these instructions. Written discharge instructions were also given to supplement this information.    Condition at Discharge: Stable    Discharge Diagnoses:    Hospital Problems          Active Problems    * (Principal) Trauma       Surgical Procedures:     None    Significant Diagnostic Studies and Procedures: noted in brief hospital course    Consults:    CONSULT INTERNAL MEDICINE PHYSICIAN  CONSULT GERIATRICS/TRANSITIONS OF CARE  CONSULT NEUROSURGERY PHYSICIAN  CONSULT CARDIOLOGY PHYSICIAN    Patient Disposition: Home       Patient instructions/medications:   Scheduled appointments:      Oct 09, 2022 8:00 AM  Office visit with Lyndel Safe, MD  Cardiovascular Medicine: [facility] (CVM Exam) [address]  [address], [address]  [place] [address]  [phone]   Additional appointment instructions:    Thank you for letting us take care of you!  1. You were seen for a traumatic head injury we did do stability scanning which was normal.  We also took a scan of your face that did not show any fracture.  You will need to follow-up outpatient with the TBI clinic.  We also had you see cardiology due to your bradycardia.  They recommended an echo which we completed here at the hospital.  They want you to follow-up with your cardiologist outpatient and we will hold your Coreg until you follow-up with your cardiologist.  I do also recommend that you quit drinking and smoking these can increase your risk of having other problems to your health.  2. Follow-up with your cardiologist and the TBI clinic.  You will be called for appointments.  3. You were given a prescription for Keppra take this twice a day for the next 6 days.  4. Continue all of your home medications.   5. Watch for signs and symptoms like altered mental status, worsening of symptoms, chest pain, shortness of breath, multiple episodes of vomiting, or any other concerns.  6. Please return immediately if you get worse, you don?t get better or you develop any new or concerning symptoms.       Outside [place] Area:  [email or web address]-[email or web address]    [place] Substance Abuse Resources:  Print production planner   Adult Re[facility]) - [facility]  [phone]                        Admissions: [phone] [address] [place] Adults, Men Long-term residential program assists up to 135 men, with treatable handicaps, drug dependence, alcohol addiction or other problems, at one time. Residents receive addiction counseling and are taught skills and given responsibilities that will help them return to  community living. 180-day minimum, programs can last 6-12 months.   Al-Anon  24 hour ([place]):                    [phone]       [facility]: [phone] Numerous meeting locations throughout [facility] and beyond. Adults and adolescents. No dues or fees. At Al-Anon Family Group meetings, the friends and family members of problem drinkers share their experiences and learn how to apply the principles of the Al-Anon program to their individual situations. Younger family members and friends attend [employer].   Alcoholics Anonymous - [employer]  [phone]    (24 hours/day) [address] [place], [place]  Adults and adolescents. No dues or fees. An international fellowship of men and women who have had a drinking problem. It is nonprofessional, self-supporting, multiracial, apolitical, and available almost everywhere. There are no age or education requirements. Membership is open to anyone who wants to do something about his or her drinking problem.   [facility]  [phone] [address], [place] Adults, family.  Sliding scale. Assessment, referral, court-monitoring, individual/group treatment, family/couples counseling, recovery monitoring, intervention, sober companion.   [facility]   Main: [phone]  Crisis line:              [phone] [address], [place] Multiple locations in [place], [place], area. Adults, adolescents Residential services, day treatment, outpatient services, adolscent substance abuse program, case management, individual and family therapy, group education/therapy, continuing care.    [facility]    [phone] [address]          [place], [place]  Adults.  Initial assessment is no charge.   Inpatient detox from alcohol, benzos, and opiates.  Dual diagnosis inpatient treatment for those with primary mental health disorder and detox needs for meth, marijuana, hallucinogens, and K2. Cognitive behavioral therapy, med management, holistic therapies, and internet based recovery tools.   [facility]  Level I, II [facility]):           [phone]       [facility], inpt women): [phone]       [facility], inpt men): [phone]       Women's Recovery [facility]): [phone] Multiple [place] locations, [place], [place], [place]. Adults, adolescents, pregnant women.  Medicaid and most insurance (no Medicare).  [place] residents whose income is less than 200% of the federal poverty level may have treatment reimbursed.  Payment arrangements may be made using a sliding fee scale. Outpatient treatment, individual/group counseling, assessment, court ordered treatment, non-medical/social detox, intensive short-term residential stays, extended residential, intensive outpatient treatment with housing, women's recovery center.                  Regular Diet    You have no dietary restriction. Please continue with a healthy balanced diet.     Discharge Signs/Symptoms    Standard Please contact your doctor if you have any of the following symptoms: temperature higher than 100.4 degrees F, uncontrolled pain, persistent nausea and/or vomiting, difficulty breathing, chest pain, severe abdominal pain, headache,  unable to urinate, unable to have bowel movement, or drainage with a foul odor     Questions About Your Stay    For questions or concerns regarding your hospital stay, call [phone].     Discharging attending physician: Ronn Melena [[id number]]      Activity as Tolerated    It is important to keep increasing your activity level after you leave the hospital.  Moving around can help prevent blood clots, lung infection (pneumonia) and other problems.  Gradually increasing the number of times you are up moving around will help you return to your normal activity level more quickly.  Continue to increase the number of times you are up to the chair and walking daily to return to your normal activity level. Begin to work towards your normal activity level at discharge.     Return Appointment    Please arrange a hospital follow-up appointment with your primary care physician (PCP). It is our recommendation that you see your PCP within 7 days of discharge.  We ask that you bring all hospital paperwork to this appointment for your physician to review as they may be responsible for medication refills.     Return Appointment    You may follow-up in the Trauma Surgery Clinic on an as needed basis. Please call [phone] if you would like to schedule an appointment or with any questions or concerns.     Appointment Request: Neurosurgery     Diagnosis or reason for outpatient appointment request: Intracranial hemorrhage    Was the patient seen in hospital by neurosurgery? Yes    Pager # of requesting provider if there are any questions? Number not on file      Appointment Request: Neurosurgery     Diagnosis or reason for outpatient appointment request: Right Acute SDH, tSAH s/p fall    Did this patient have surgery by neurosurgeon during hospital stay? No    Was the patient seen in hospital by neurosurgery? Yes    Is there a requested provider for follow up? Yes    If so, whom? Filardi-6 weeks TBI    What is the time frame for requested outpatient appointment? Filardi-6 weeks TBI    Pager # of requesting provider if there are any questions? [phone] Request for Cardiology Appointment   Standing Status: Future Standing Exp. Date: 03/31/24     Scheduling Priority: First Available (7-10 days)    Schedule OV with (1st choice Provider) Nickolas Madrid M.D.    Schedule OV with (2nd Choice Provider) Hospital Follow-Up - General Cardiology    Office Visit or Telehealth No Preference    Visit Type Hospital Follow-up    Needs Procedure or Device Check? Office Visit Only    Location of Appointment [facility]    Will this patient be discharged with home health or to a continued care facility (SNF/Rehab)? No       Current Discharge Medication List         START taking these medications    Details   levETIRAcetam (KEPPRA) 500 mg tablet Take one tablet by mouth twice daily for 6 days. Indications: additional medication for tonic-clonic epilepsy  Qty: 12 tablet, Refills: 0    PRESCRIPTION TYPE:  Normal            CONTINUE these medications which have NOT CHANGED    Details   levothyroxine (SYNTHROID) 88 mcg tablet Take one tablet by mouth daily 30 minutes before breakfast.    PRESCRIPTION TYPE:  Historical Med      losartan (COZAAR) 50 mg tablet Take one tablet by mouth twice daily. Indications: high blood pressure  Qty: 180 tablet, Refills: 3    PRESCRIPTION TYPE:  Normal      MEN'S MULTI-VITAMIN PO Take 1 tablet by mouth daily.    PRESCRIPTION TYPE:  Historical Med      omeprazole DR (PRILOSEC) 40 mg capsule Take one capsule by mouth every 48 hours.    PRESCRIPTION TYPE:  Historical Med      sildenafiL (VIAGRA) 50 mg tablet Take one tablet by mouth as Needed for Erectile dysfunction.    PRESCRIPTION TYPE:  Historical Med            The following medications were removed from your list. This list includes medications discontinued this stay and those removed from your prior med list in our system        carvediloL (COREG) 25 mg tablet               Pending items needing follow up: None    Total time spent: 45 minutes    Signed:  Emeline General, APRN-NP  Electronically Signed  10/04/2022 6:06 AM  Trauma Team Pager: [phone]     cc:  Primary Care Physician:  [name]   Verified  Referring physicians:     Additional provider(s):

## 2022-10-05 ENCOUNTER — Encounter: Admit: 2022-10-05 | Discharge: 2022-10-05 | Payer: BC Managed Care – PPO

## 2022-10-05 LAB — VITAMIN B1 (THIAMINE) WHOLE BLD: VITAMIN B1,THIAMINE: 144

## 2022-10-06 NOTE — Progress Notes
Critical Care Progress Note Update    Acute pain due to trauma - cont IV/PO pain control  Hypothyroidism - cont PTA meds  HTN - cont PTA meds  Bradycardia - c/t monitor closely  SDH - cont q 4 hr neuro checks  Aggressive pulmonary regimen  Ambulate as tolerated    Ronn Melena, MD

## 2022-10-09 ENCOUNTER — Encounter: Admit: 2022-10-09 | Discharge: 2022-10-09 | Payer: BC Managed Care – PPO

## 2022-10-09 ENCOUNTER — Ambulatory Visit: Admit: 2022-10-09 | Discharge: 2022-10-10 | Payer: MEDICARE

## 2022-10-09 DIAGNOSIS — E039 Hypothyroidism, unspecified: Secondary | ICD-10-CM

## 2022-10-09 DIAGNOSIS — S065XAA SDH (subdural hematoma) (HCC): Secondary | ICD-10-CM

## 2022-10-09 DIAGNOSIS — E785 Hyperlipidemia, unspecified: Secondary | ICD-10-CM

## 2022-10-09 DIAGNOSIS — Z8669 Personal history of other diseases of the nervous system and sense organs: Secondary | ICD-10-CM

## 2022-10-09 DIAGNOSIS — F1011 Alcohol abuse, in remission: Secondary | ICD-10-CM

## 2022-10-09 DIAGNOSIS — E6609 Other obesity due to excess calories: Secondary | ICD-10-CM

## 2022-10-09 DIAGNOSIS — M21372 Foot drop, left foot: Secondary | ICD-10-CM

## 2022-10-09 DIAGNOSIS — R42 Dizziness and giddiness: Secondary | ICD-10-CM

## 2022-10-09 DIAGNOSIS — I10 Essential (primary) hypertension: Secondary | ICD-10-CM

## 2022-10-09 DIAGNOSIS — T1490XA Injury, unspecified, initial encounter: Secondary | ICD-10-CM

## 2022-10-09 DIAGNOSIS — R001 Bradycardia, unspecified: Secondary | ICD-10-CM

## 2022-10-09 MED ORDER — AMLODIPINE 5 MG PO TAB
5 mg | ORAL_TABLET | Freq: Every day | ORAL | 3 refills | Status: AC
Start: 2022-10-09 — End: ?

## 2022-10-09 NOTE — Progress Notes
Date of Service: 10/09/2022    Patient: Johnny Mosley; OZH:[id number]; DOB: 04-06-1950  His PCP is [name].    Referring physician:Turner, Jonna Coup, MD         Subjective:  Johnny Mosley is a 72 y.o. male who presents to the advanced heart failure clinic for follow up visit.  He used to follow with Dr. Avie Arenas before.     He has past medical history of hypertension, hyperlipidemia, recent history of subdural hematoma in August 2024, alcohol use, tobacco abuse, hypothyroidism and obesity.    He was admitted from 10/01/22 to 10/02/22 after ground level fall and trauma leading to subdural hematoma.   CTH demonstrates R acute SDH without mass effect or midline shift and trace SAH within the R temporal lobe. Patient is neurologically intact on exam. Seizure. He was seen and evaluated by cardiology for syncope and carvedilol was stopped and will follow-up outpatient with cardiology. Ziopatch was placed on discharge.     Today, he presents to clinic with his wife.  Since his fall he has been using walker for walking.  He also noticed dizziness on standing up. The dizziness began following a fall approximately two weeks ago, which resulted in a brain bleed. The patient reports that he had been drinking alcohol on the day of the fall. He was attempting to move a picnic table on his patio when he tripped on the stairs and fell, hitting his head on the concrete driveway. He did not seek immediate medical attention but went to the hospital a week later after experiencing two more falls at home due to dizziness. The patient also reports experiencing headaches, described as migraines over his eyes, and throbbing in his head when walking.    The patient's wife, Johnny Mosley, has been monitoring his blood pressure at home, which has been elevated. The patient has been wearing a Zio patch since his discharge from the hospital to monitor his heart rate, which had dropped to 30 during his hospital stay. The patient has stopped drinking alcohol since the incident and continues to chew tobacco.    Baseline functional status: Walks independently using walker.        Past Medical History:    Past Medical History:   Diagnosis Date    Dizziness 09-25-22    Larey Seat and hit head    Dyslipidemia     Essential hypertension     Foot drop, left foot     H/O alcohol abuse     H/O carpal tunnel syndrome     Hypothyroid           Surgical History:   Procedure Laterality Date    COLONOSCOPY      ECHOCARDIOGRAM PROCEDURE  10-02-22    Normal    ELECTROCARDIOGRAM      HX CARPAL TUNNEL RELEASE      HX WISDOM TEETH EXTRACTION          Family History:  Noncontributory    Social History:  He is married.  He lives with his wife.  He drinks 5-6 beers daily.  He stopped eating since October 01 2022 after the fall.  He continues to chew tobacco daily.  Denies any other illicit drugs.  He has 2 kids.  He is currently retired.  He retired in 2013.  He did multiple jobs before that.  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    Review of Systems:  General: Denies fever, chills,  malaise, night sweats, significant weight change  HEENT: negative for dry/itchy eyes, congestion, sore throat  Pulm: negative for cough, hemoptysis, and shortness of breath at rest  CV: as per HPI  GI: negative for N/V, abdominal pain, blood in stools, constipation, diarrhea  GU: negative for dysuria, hematuria  MSK: negative for joint pain, joint swelling, muscle pain/weakness  Neuro: Positive for dizziness.  Recent history of falls.  Recent history of subdural hematoma.  Positive for headaches too.  Heme/Lymph: Bruises very easily.    Derm: Positive for bluish discoloration around his right eye on the cheek.    Medications:    Current Outpatient Medications:     amLODIPine (NORVASC) 5 mg tablet, Take one tablet by mouth daily., Disp: 90 tablet, Rfl: 3    levothyroxine (SYNTHROID) 88 mcg tablet, Take one tablet by mouth daily 30 minutes before breakfast., Disp: , Rfl: losartan (COZAAR) 50 mg tablet, Take one tablet by mouth twice daily. Indications: high blood pressure, Disp: 180 tablet, Rfl: 3    omeprazole DR (PRILOSEC) 40 mg capsule, Take one capsule by mouth every 48 hours., Disp: , Rfl:     sildenafiL (VIAGRA) 50 mg tablet, Take one tablet by mouth as Needed for Erectile dysfunction., Disp: , Rfl:     Allergies: No Known Allergies    Objective:  BP (!) 145/106 (BP Source: Arm, Left Upper, Patient Position: Sitting)  - Pulse 94  - Ht 172.7 cm (5' 8)  - Wt 90.4 kg (199 lb 3.2 oz)  - SpO2 98%  - BMI 30.29 kg/m?   BP Readings from Last 3 Encounters:   10/09/22 (!) 145/106   10/02/22 116/70   03/29/22 (!) 146/84      Pulse Readings from Last 3 Encounters:   10/09/22 94   10/02/22 64   03/29/22 88     Wt Readings from Last 8 Encounters:   10/09/22 90.4 kg (199 lb 3.2 oz)   10/02/22 87.4 kg (192 lb 10.9 oz)   03/29/22 92.1 kg (203 lb)   03/28/21 88.9 kg (196 lb)   05/14/19 90.2 kg (198 lb 12.8 oz)   09/23/18 91.6 kg (202 lb)   06/04/18 92.1 kg (203 lb)   02/27/18 93.4 kg (206 lb)     Constitutional: He appears well-developed and well-nourished.   HENT:  Head: Normocephalic.   Mouth/Throat: Oropharynx is clear and moist.   Eyes: Conjunctivae are normal.   Neck: Normal range of motion. No JVD present.  No carotid bruit.  Cardiovascular: Normal rate, regular rhythm, normal heart sounds and intact distal pulses.  No murmur heard.  Pulmonary/Chest: Effort normal and breath sounds normal. No respiratory distress. He has no wheezes. He has no rales. He exhibits no chest wall tenderness.   Abdominal: Soft. Bowel sounds are normal. He exhibits no distension. There is no tenderness.   Musculoskeletal: Normal range of motion and muscular tone.  There is presence of trace bilateral lower limb edema.  Neurological: He is alert and oriented to person, place, and time. No focal deficits.  Skin: Skin is warm. No erythema.   Psychiatric: He has a normal mood and affect. Judgment, behavior, and thought content normal.       Labs Reviewed:  CBC w/Diff    Lab Results   Component Value Date/Time    WBC 6.7 10/02/2022 03:13 AM    RBC 3.84 (L) 10/02/2022 03:13 AM    HGB 13.3 (L) 10/02/2022 03:13 AM    HCT 38.8 (L) 10/02/2022 03:13  AM    MCV 101.2 (H) 10/02/2022 03:13 AM    MCH 34.7 (H) 10/02/2022 03:13 AM    MCHC 34.3 10/02/2022 03:13 AM    RDW 14.2 10/02/2022 03:13 AM    PLTCT 285 10/02/2022 03:13 AM    MPV 7.2 10/02/2022 03:13 AM    No results found for: NEUT, ANC, LYMA, ALC, MONA, AMC, EOSA, AEC, BASA, ABC     Comprehensive Metabolic Profile    Lab Results   Component Value Date/Time    NA 133 (L) 10/02/2022 03:13 AM    K 4.3 10/02/2022 03:13 AM    K 3.2 (L) 10/01/2022 05:24 PM    K 4.3 03/09/2021 12:00 AM    K 4.3 02/03/2018 12:00 AM    CL 99 10/02/2022 03:13 AM    CO2 24 10/02/2022 03:13 AM    GAP 10 10/02/2022 03:13 AM    BUN 7 10/02/2022 03:13 AM    CR 0.81 10/02/2022 03:13 AM    CR 0.89 10/01/2022 05:24 PM    CR 0.80 03/09/2021 12:00 AM    CR 1.17 02/03/2018 12:00 AM    GLU 93 10/02/2022 03:13 AM    Lab Results   Component Value Date/Time    CA 8.4 (L) 10/02/2022 03:13 AM    PO4 3.3 10/02/2022 03:13 AM    ALBUMIN 3.7 10/01/2022 05:24 PM    TOTPROT 6.0 10/01/2022 05:24 PM    ALKPHOS 49 10/01/2022 05:24 PM    AST 23 10/01/2022 05:24 PM    ALT 18 10/01/2022 05:24 PM    TOTBILI 0.5 10/01/2022 05:24 PM    GFR 66.3 02/03/2018 12:00 AM        Cardiac Enzymes Lipids   No results found for: BNP, TNI Lab Results   Component Value Date    CHOL 189 04/23/2022    TRIG 139 04/23/2022    HDL 64 04/23/2022    LDL 98 04/23/2022    VLDL 28 04/23/2022    CHOLHDLC 3 04/23/2022         Coagulation Endocrine   Lab Results   Component Value Date/Time    INR 1.1 10/01/2022 05:24 PM    PT 11.7 10/01/2022 05:24 PM    Lab Results   Component Value Date/Time    TSH 2.10 10/02/2022 01:04 PM             Imaging and procedures reviewed:  Echo:  10/02/22:  No regional wall motion abnormalities are seen. Overall left ventricular systolic function appears normal. The estimated left ventricular ejection fraction is 60%.   Normal left ventricular diastolic function. Normal left atrial pressure.   The right ventricle is not visualized well.  Limited images suggest mild right ventricular enlargement with normal right ventricular systolic function.  Mild left atrial enlargement.  The aortic, mitral and tricuspid cardiac valves appear structurally normal.  Mild mitral valve regurgitation and mild tricuspid valve regurgitation are noted by Doppler exam.  The aortic root and the visualized portions of the ascending aorta appear normal in size.  No pericardial effusion is seen.  The estimated  peak systolic PA pressure = 39 mmHg.     No other echocardiograms were available for comparison    Stress test: 08/13/2017:  SUMMARY/OPINION:   This study is normal.  No definite perfusion defects are present.  Global left ventricular function is within normal limits.  High risk indicators are not noted.     CT of the head on 10/01/22:  1. Stable acute subdural  hemorrhage along the right cerebral convexity as   well as minimal right parafalcine subdural hemorrhage.   2. Stable mild subarachnoid hemorrhage along the anterior right sylvian   fissure. No new hemorrhage identified.     CT cervical spine:   1. No acute fracture or subluxation.   2. Degenerative changes in cervical spine with areas of at least moderate   central canal narrowing and moderate to marked foraminal stenosis.         There is no immunization history on file for this patient.      Assessment/Plan:  Orrie Rampley is a 71 y.o. male with  past medical history of hypertension, hyperlipidemia, recent history of subdural hematoma, alcohol use which is currently in remission, tobacco abuse, hypothyroidism and obesity presents to clinic after recent fall for hypertension management and ruling out cardiac cause of fall.      #Hypertension  #Sinus bradycardia  #Fall  -Blood pressure in the clinic of 145/106 mmHg.  Patient showed blood pressure reading from home and his blood pressure has been running around 145/99-154/102 mmHg.  His blood pressure has been uncontrolled since stopping his carvedilol after recent admission to [facility] from 8/12 -10/02/22 due to subdural hematoma.  He was found to have bradycardia during that admission.  > His fall most likely was mechanical.  He is wearing Zio patch for 2 weeks to rule out arrhythmia as cause of fall.  We will follow-up on the results.  > I will continue to hold his carvedilol.  For his uncontrolled hypertension I will start patient on amlodipine 5 mg daily.  We will continue losartan 50 mg twice daily  > We will check orthostatic vitals today: This was negative : 146/86 mmHg while laying and 146/96 mmHg while standing up.   > Advised patient to wear compression stockings.    #Subdural hematoma  #Subarachnoid hemorrhage  -CT head from 10/01/22 mentioned above.  No neurological weakness.  No midline shift.  > He follows with neurosurgery.  He has upcoming appointment on 11/26/22.    #Alcohol abuse  History of heavy drinking, recently abstinent since the fall.  -Encourage continued abstinence or moderation if resumed.    Tobacco Use  Current user of chewing tobacco.  -Encourage cessation, offered nicotine patches but patient not interested.    Return to heart failure clinic: in 2 to 3 months after Zio patch results.  If Zio patch shows no significant arrhythmias then he can continue to follow-up with PCP for hypertension management.    Thank you for allowing me to participate in the care of this patient.  Please do not hesitate to contact me should you have any questions or concerns.         Total time spent on today's office visit was 32 minutes. This includes face-to-face in person visit with patient as well as nonface-to-face time including review of the EMR, outside records, labs, radiologic studies, echocardiogram & other cardiovascular studies, formulation of treatment plan, after visit summary, future disposition, and lastly on documentation.    Lyndel Safe, MD  Advanced Heart Failure and Heart Transplant Cardiologist  [facility]  [facility]  Pager: [phone]

## 2022-11-22 ENCOUNTER — Encounter: Admit: 2022-11-22 | Discharge: 2022-11-22 | Payer: BC Managed Care – PPO

## 2022-11-22 NOTE — Telephone Encounter
Verified 2 patient identifiers    This RN contacted patient and spoke to wife, [name]. This RN explained the reason for the call is to confirm the upcoming appointment in the TBI clinic. This RN explained the goal of the clinic and the roles of the two providers that run the clinic. [name] explained to this RN the patient is not doing any outpatient therapy at this time, and is doing a lot better since the injury. [name] stated the patient has followed up with his local doctor, eye doctor, and has an appointment to see his heart doctor soon. The only issue the patient has is his right eye is still a little blurry, since that's the side he hit, but the patient has followed up with his eye doctor. The eye doctor has cleared the patient. [name] is going to follow up with the patient and ask if he feels he needs the appointment.     [name] called back into the clinic and informed this RN she spoke with the patient. At this time the patient has no lightheadedness, no dizziness, and no headaches. The patient agrees he does not need the appointment at this time. This RN recommended the patient follow up with his local PCP for any future concerns. Cindy verbalized understanding and voiced no additional concerns at this time.     This RN will cancel the appointment and request

## 2022-11-26 ENCOUNTER — Encounter: Admit: 2022-11-26 | Discharge: 2022-11-26 | Payer: BC Managed Care – PPO

## 2022-12-31 ENCOUNTER — Encounter: Admit: 2022-12-31 | Discharge: 2022-12-31 | Payer: BC Managed Care – PPO

## 2023-01-03 ENCOUNTER — Encounter: Admit: 2023-01-03 | Discharge: 2023-01-03 | Payer: BC Managed Care – PPO

## 2023-01-03 ENCOUNTER — Ambulatory Visit: Admit: 2023-01-03 | Discharge: 2023-01-03 | Payer: BC Managed Care – PPO

## 2023-01-03 DIAGNOSIS — S065XAA SDH (subdural hematoma) (HCC): Secondary | ICD-10-CM

## 2023-01-03 DIAGNOSIS — I491 Atrial premature depolarization: Secondary | ICD-10-CM

## 2023-01-03 DIAGNOSIS — I10 Essential (primary) hypertension: Secondary | ICD-10-CM

## 2023-01-03 DIAGNOSIS — Z720 Tobacco use: Secondary | ICD-10-CM

## 2023-01-03 DIAGNOSIS — Z136 Encounter for screening for cardiovascular disorders: Secondary | ICD-10-CM

## 2023-01-03 MED ORDER — HYDROCHLOROTHIAZIDE 25 MG PO TAB
25 mg | ORAL_TABLET | Freq: Every morning | ORAL | 3 refills | 28.00000 days | Status: AC
Start: 2023-01-03 — End: ?

## 2023-04-17 ENCOUNTER — Encounter: Admit: 2023-04-17 | Discharge: 2023-04-17 | Payer: BC Managed Care – PPO

## 2023-04-17 MED ORDER — LOSARTAN 50 MG PO TAB
50 mg | ORAL_TABLET | Freq: Two times a day (BID) | ORAL | 3 refills | 90.00000 days | Status: AC
Start: 2023-04-17 — End: ?

## 2023-05-08 ENCOUNTER — Encounter: Admit: 2023-05-08 | Discharge: 2023-05-08 | Payer: BC Managed Care – PPO

## 2023-12-13 ENCOUNTER — Encounter: Admit: 2023-12-13 | Discharge: 2023-12-13 | Payer: BLUE CROSS/BLUE SHIELD

## 2023-12-13 DIAGNOSIS — R001 Bradycardia, unspecified: Secondary | ICD-10-CM

## 2023-12-13 DIAGNOSIS — I491 Atrial premature depolarization: Principal | ICD-10-CM

## 2023-12-13 MED ORDER — HYDROCHLOROTHIAZIDE 25 MG PO TAB
25 mg | ORAL_TABLET | Freq: Every morning | ORAL | 0 refills | 28.00000 days | Status: AC
Start: 2023-12-13 — End: ?

## 2023-12-13 NOTE — Telephone Encounter [36]
Attempted to contact patient, LVM x1.

## 2024-01-06 ENCOUNTER — Encounter: Admit: 2024-01-06 | Discharge: 2024-01-06 | Payer: BLUE CROSS/BLUE SHIELD

## 2024-01-06 MED ORDER — HYDROCHLOROTHIAZIDE 25 MG PO TAB
25 mg | ORAL_TABLET | Freq: Every morning | ORAL | 3 refills | 28.00000 days | Status: AC
Start: 2024-01-06 — End: ?
